# Patient Record
Sex: Male | Born: 1995
Health system: Southern US, Community
[De-identification: ages and names within clinical notes are randomized; demographics above are authoritative.]

## PROBLEM LIST (undated history)

## (undated) ENCOUNTER — Emergency Department (HOSPITAL_COMMUNITY): Admission: EM | Payer: No Typology Code available for payment source | Source: Home / Self Care

---
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
## 2017-03-10 ENCOUNTER — Encounter (HOSPITAL_COMMUNITY): Payer: Self-pay | Admitting: Emergency Medicine

## 2017-03-10 ENCOUNTER — Emergency Department (HOSPITAL_COMMUNITY)
Admission: EM | Admit: 2017-03-10 | Discharge: 2017-03-10 | Disposition: A | Discharge status: Home / Self Care | Payer: Medicaid Other | Attending: Emergency Medicine | Admitting: Emergency Medicine

## 2017-03-10 DIAGNOSIS — S3992XA Unspecified injury of lower back, initial encounter: Secondary | ICD-10-CM | POA: Diagnosis present

## 2017-03-10 DIAGNOSIS — Y9389 Activity, other specified: Secondary | ICD-10-CM | POA: Insufficient documentation

## 2017-03-10 DIAGNOSIS — S39012A Strain of muscle, fascia and tendon of lower back, initial encounter: Secondary | ICD-10-CM | POA: Diagnosis not present

## 2017-03-10 DIAGNOSIS — X503XXA Overexertion from repetitive movements, initial encounter: Secondary | ICD-10-CM | POA: Diagnosis not present

## 2017-03-10 DIAGNOSIS — Y990 Civilian activity done for income or pay: Secondary | ICD-10-CM | POA: Insufficient documentation

## 2017-03-10 DIAGNOSIS — Y929 Unspecified place or not applicable: Secondary | ICD-10-CM | POA: Insufficient documentation

## 2017-03-10 LAB — CBC WITH DIFFERENTIAL/PLATELET
Basophils Absolute: 0 10*3/uL (ref 0.0–0.1)
Basophils Relative: 0 %
Eosinophils Absolute: 0.1 10*3/uL (ref 0.0–0.7)
Eosinophils Relative: 1 %
HCT: 47.5 % (ref 39.0–52.0)
Hemoglobin: 16.5 g/dL (ref 13.0–17.0)
Lymphocytes Relative: 28 %
Lymphs Abs: 1.9 10*3/uL (ref 0.7–4.0)
MCH: 31.3 pg (ref 26.0–34.0)
MCHC: 34.7 g/dL (ref 30.0–36.0)
MCV: 90 fL (ref 78.0–100.0)
Monocytes Absolute: 0.6 10*3/uL (ref 0.1–1.0)
Monocytes Relative: 9 %
Neutro Abs: 4.4 10*3/uL (ref 1.7–7.7)
Neutrophils Relative %: 62 %
Platelets: 187 10*3/uL (ref 150–400)
RBC: 5.28 MIL/uL (ref 4.22–5.81)
RDW: 12.8 % (ref 11.5–15.5)
WBC: 7 10*3/uL (ref 4.0–10.5)

## 2017-03-10 LAB — BASIC METABOLIC PANEL
Anion gap: 10 (ref 5–15)
BUN: 10 mg/dL (ref 6–20)
CHLORIDE: 101 mmol/L (ref 101–111)
CO2: 28 mmol/L (ref 22–32)
CREATININE: 1.03 mg/dL (ref 0.61–1.24)
Calcium: 9.4 mg/dL (ref 8.9–10.3)
GFR calc Af Amer: >60 mL/min (ref >60)
GFR calc non Af Amer: >60 mL/min (ref >60)
Glucose, Bld: 95 mg/dL (ref 65–99)
Potassium: 3.8 mmol/L (ref 3.5–5.1)
SODIUM: 139 mmol/L (ref 135–145)

## 2017-03-10 LAB — URINALYSIS, ROUTINE W REFLEX MICROSCOPIC
BILIRUBIN URINE: NEGATIVE
Glucose, UA: NEGATIVE mg/dL
Hgb urine dipstick: NEGATIVE
KETONES UR: NEGATIVE mg/dL
Leukocytes, UA: NEGATIVE
Nitrite: NEGATIVE
PH: 7 (ref 5.0–8.0)
Protein, ur: NEGATIVE mg/dL
SPECIFIC GRAVITY, URINE: 1.025 (ref 1.005–1.030)

## 2017-03-10 MED ORDER — CYCLOBENZAPRINE HCL 5 MG PO TABS: 5.0000 mg | ORAL_TABLET | Freq: Two times a day (BID) | ORAL | 0 refills | Status: DC | PRN

## 2017-03-10 MED ORDER — NAPROXEN 500 MG PO TABS: 500.0000 mg | ORAL_TABLET | Freq: Two times a day (BID) | ORAL | 0 refills | Status: DC | PRN

## 2017-03-10 NOTE — ED Triage Notes (Signed)
Patient reports right flank pain onset Monday with mild intermittent dysuria , denies injury or hematuria , no fever or chills .

## 2017-03-10 NOTE — ED Provider Notes (Signed)
[facility] Provider Note    By signing my name below, I, Earmon Phoenix, attest that this documentation has been prepared under the direction and in the presence of Fayrene Helper, PA-C. Electronically Signed: Earmon Phoenix, ED Scribe. 03/10/17. 7:57 PM.    History   Chief Complaint Chief Complaint  Patient presents with  . Flank Pain   The history is provided by the patient and medical records. No language interpreter was used.    Darrell Winters is a 21 y.o. male who presents to the Emergency Department complaining of worsening right flank pain that began three days ago. He states the pain began as just soreness but started being sharp yesterday. He has not taken anything for pain. Stretching mildly increases the pain. Not touching the area helps alleviate the pain. He denies fever, chills, diarrhea, nausea, vomiting, difficulty urinating, hematuria, penile or testicular pain, numbness, tingling or weakness of the lower extremities. He states he had his urine checked about 4-6 weeks ago and was told he had traces of blood in the urine at that time. He denies any trauma, injury or fall. He denies any heavy lifting. 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 He denies allergies to any medications. He denies taking any daily medications. His PCP is Dr. Welton Flakes.   History reviewed. No pertinent past medical history.  There are no active problems to display for this patient.   History reviewed. No pertinent surgical history.     Home Medications    Prior to Admission medications   Medication Sig Start Date End Date Taking? Authorizing Provider  albuterol (PROVENTIL HFA;VENTOLIN HFA) 108 (90 BASE) MCG/ACT inhaler Inhale 2 puffs into the lungs every 6 (six) hours as needed for wheezing or shortness of breath. 04/20/15   Jennifer Piepenbrink, PA-C  amoxicillin (AMOXIL) 500 MG capsule Take 1 capsule (500 mg total)  by mouth 3 (three) times daily. 04/20/15   Jennifer Piepenbrink, PA-C  cyclobenzaprine (FLEXERIL) 5 MG tablet Take 1 tablet (5 mg total) by mouth 2 (two) times daily as needed for muscle spasms. 03/10/17   Fayrene Helper, PA-C  guaiFENesin (ROBITUSSIN) 100 MG/5ML liquid Take 5-10 mLs (100-200 mg total) by mouth every 4 (four) hours as needed for cough. 04/20/15   Jennifer Piepenbrink, PA-C  naproxen (NAPROSYN) 500 MG tablet Take 1 tablet (500 mg total) by mouth 2 (two) times daily as needed for mild pain or moderate pain. 03/10/17   Fayrene Helper, PA-C    Family History No family history on file.  Social History Social History  Substance Use Topics  . Smoking status: Never Smoker  . Smokeless tobacco: Never Used  . Alcohol use No     Allergies   Patient has no known allergies.   Review of Systems Review of Systems  Constitutional: Negative for chills and fever.  Gastrointestinal: Negative for diarrhea, nausea and vomiting.  Genitourinary: Positive for dysuria and flank pain. Negative for difficulty urinating, hematuria, penile pain and testicular pain.  Neurological: Negative for weakness and numbness.     Physical Exam Updated Vital Signs BP 136/73 (BP Location: Left Arm)   Pulse 72   Temp 98.2 F (36.8 C) (Oral)   Resp 16   Ht  (1.727 m)   Wt 155 lb (70.3 kg)   SpO2 99%   BMI 23.57 kg/m   Physical Exam  Constitutional: He is oriented to person, place, and time. He appears well-developed  and well-nourished.  HENT:  Head: Normocephalic and atraumatic.  Neck: Normal range of motion.  Cardiovascular: Normal rate.   Pulmonary/Chest: Effort normal.  Abdominal: There is no CVA tenderness.  Musculoskeletal: Normal range of motion. He exhibits tenderness. He exhibits no deformity.  No midline spine tenderness. Tenderness to palpation to right paraspinal lumbar region at level of L2-L4. No overlying skin changes.  Neurological: He is alert and oriented to person, place, and  time.  Patellar DTRs intact bilaterally. Ambulates without difficulty.  Skin: Skin is warm and dry.  Psychiatric: He has a normal mood and affect. His behavior is normal.  Nursing note and vitals reviewed.    ED Treatments / Results  DIAGNOSTIC STUDIES: Oxygen Saturation is 99% on RA, normal by my interpretation.   COORDINATION OF CARE: 7:53 PM- Reassured pt that his symptoms seem to be musculoskeletal in nature. Return precautions discussed. Will prescribe Naprosyn and Flexeril. Pt verbalizes understanding and agrees to plan.  Medications - No data to display  Labs (all labs ordered are listed, but only abnormal results are displayed) Labs Reviewed  CBC WITH DIFFERENTIAL/PLATELET  BASIC METABOLIC PANEL  URINALYSIS, ROUTINE W REFLEX MICROSCOPIC    EKG  EKG Interpretation None       Radiology No results found.  Procedures Procedures (including critical care time)  Medications Ordered in ED Medications - No data to display   Initial Impression / Assessment and Plan / ED Course  I have reviewed the triage vital signs and the nursing notes.  Pertinent labs & imaging results that were available during my care of the patient were reviewed by me and considered in my medical decision making (see chart for details).     BP 136/73 (BP Location: Left Arm)   Pulse 72   Temp 98.2 F (36.8 C) (Oral)   Resp 16   Ht  (1.727 m)   Wt 70.3 kg   SpO2 99%   BMI 23.57 kg/m    Final Clinical Impressions(s) / ED Diagnoses   Final diagnoses:  Low back strain, initial encounter    New Prescriptions New Prescriptions   CYCLOBENZAPRINE (FLEXERIL) 5 MG TABLET    Take 1 tablet (5 mg total) by mouth 2 (two) times daily as needed for muscle spasms.    I personally performed the services described in this documentation, which was scribed in my presence. The recorded information has been reviewed and is accurate.        Fayrene Helper, PA-C 04/08/17 2003    Nira Conn, MD 04/08/17 2216

## 2017-03-11 NOTE — ED Provider Notes (Signed)
By signing my name below, I, Earmon Phoenix, attest that this documentation has been prepared under the direction and in the presence of Fayrene Helper, PA-C. Electronically Signed: Earmon Phoenix, ED Scribe. 03/10/17. 7:57 PM.    History              Chief Complaint    Chief Complaint  Patient presents with  . Flank Pain   The history is provided by the patient and medical records. No language interpreter was used.    Darrell Winters is a 21 y.o. male who presents to the Emergency Department complaining of worsening right flank pain that began three days ago. He states the pain began as just soreness but started being sharp yesterday. He has not taken anything for pain. Stretching mildly increases the pain. Not touching the area helps alleviate the pain. He denies fever, chills, diarrhea, nausea, vomiting, difficulty urinating, hematuria, penile or testicular pain, numbness, tingling or weakness of the lower extremities. He states he had his urine checked about 4-6 weeks ago and was told he had traces of blood in the urine at that time. He denies any trauma, injury or fall. He denies any heavy lifting. 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 He denies allergies to any medications. He denies taking any daily medications. His PCP is Dr. Welton Flakes.   History reviewed. No pertinent past medical history.  There are no active problems to display for this patient.   History reviewed. No pertinent surgical history.    Home Medications            Prior to Admission medications   Medication Sig Start Date End Date Taking? Authorizing Provider  albuterol (PROVENTIL HFA;VENTOLIN HFA) 108 (90 BASE) MCG/ACT inhaler Inhale 2 puffs into the lungs every 6 (six) hours as needed for wheezing or shortness of breath. 04/20/15   Jennifer Piepenbrink, PA-C  amoxicillin (AMOXIL) 500 MG capsule Take 1 capsule (500 mg total)  by mouth 3 (three) times daily. 04/20/15   Jennifer Piepenbrink, PA-C  cyclobenzaprine (FLEXERIL) 5 MG tablet Take 1 tablet (5 mg total) by mouth 2 (two) times daily as needed for muscle spasms. 03/10/17   Fayrene Helper, PA-C  guaiFENesin (ROBITUSSIN) 100 MG/5ML liquid Take 5-10 mLs (100-200 mg total) by mouth every 4 (four) hours as needed for cough. 04/20/15   Jennifer Piepenbrink, PA-C  naproxen (NAPROSYN) 500 MG tablet Take 1 tablet (500 mg total) by mouth 2 (two) times daily as needed for mild pain or moderate pain. 03/10/17   Fayrene Helper, PA-C    Family History No family history on file.  Social History     Social History  Substance Use Topics  . Smoking status: Never Smoker  . Smokeless tobacco: Never Used  . Alcohol use No     Allergies           Patient has no known allergies.   Review of Systems Review of Systems  Constitutional: Negative for chills and fever.  Gastrointestinal: Negative for diarrhea, nausea and vomiting.  Genitourinary: Positive for dysuria and flank pain. Negative for difficulty urinating, hematuria, penile pain and testicular pain.  Neurological: Negative for weakness and numbness.     Physical Exam Updated Vital Signs BP 136/73 (BP Location: Left Arm)   Pulse 72   Temp 98.2 F (36.8 C) (Oral)   Resp 16   Ht  (1.727 m)   Wt 155 lb (70.3  kg)   SpO2 99%   BMI 23.57 kg/m   Physical Exam  Constitutional: He is oriented to person, place, and time. He appears well-developed and well-nourished.  HENT:  Head: Normocephalic and atraumatic.  Neck: Normal range of motion.  Cardiovascular: Normal rate.   Pulmonary/Chest: Effort normal.  Abdominal: There is no CVA tenderness.  Musculoskeletal: Normal range of motion. He exhibits tenderness. He exhibits no deformity.  No midline spine tenderness. Tenderness to palpation to right paraspinal lumbar region at level of L2-L4. No overlying skin changes.  Neurological: He is alert and  oriented to person, place, and time.  Patellar DTRs intact bilaterally. Ambulates without difficulty.  Skin: Skin is warm and dry.  Psychiatric: He has a normal mood and affect. His behavior is normal.  Nursing note and vitals reviewed.    ED Treatments / Results  DIAGNOSTIC STUDIES: Oxygen Saturation is 99% on RA, normal by my interpretation.   COORDINATION OF CARE: 7:53 PM- Reassured pt that his symptoms seem to be musculoskeletal in nature. Return precautions discussed. Will prescribe Naprosyn and Flexeril. Pt verbalizes understanding and agrees to plan.  Medications - No data to display  Labs (all labs ordered are listed, but only abnormal results are displayed) Labs Reviewed  CBC WITH DIFFERENTIAL/PLATELET  BASIC METABOLIC PANEL  URINALYSIS, ROUTINE W REFLEX MICROSCOPIC    EKG      EKG Interpretation None       Radiology ImagingResults(Last48hours)  No results found.    Procedures Procedures (including critical care time)  Medications Ordered in ED Medications - No data to display   Initial Impression / Assessment and Plan / ED Course  I have reviewed the triage vital signs and the nursing notes.  Pertinent labs & imaging results that were available during my care of the patient were reviewed by me and considered in my medical decision making (see chart for details).    Final Clinical Impressions(s) / ED Diagnoses   Final diagnoses:  Low back strain, initial encounter    New Prescriptions     New Prescriptions   CYCLOBENZAPRINE (FLEXERIL) 5 MG TABLET    Take 1 tablet (5 mg total) by mouth 2 (two) times daily as needed for muscle spasms.    I personally performed the services described in this documentation, which was scribed in my presence. The recorded information has been reviewed and is accurate.   Pt with reproducible low back pain.  No red flags.  Able to ambulate.  Likely MSK causing pain.  Doubt kidney stone.   SXS treatment provided.  Return precaution discussed.      Fayrene Helper, PA-C 03/11/17 1606    Nira Conn, MD 03/12/17 0040

## 2017-08-08 ENCOUNTER — Ambulatory Visit (INDEPENDENT_AMBULATORY_CARE_PROVIDER_SITE_OTHER): Payer: Medicaid Other

## 2017-08-08 ENCOUNTER — Ambulatory Visit (HOSPITAL_COMMUNITY)
Admission: EM | Admit: 2017-08-08 | Discharge: 2017-08-08 | Disposition: A | Discharge status: Home / Self Care | Payer: Medicaid Other | Attending: Internal Medicine | Admitting: Internal Medicine

## 2017-08-08 ENCOUNTER — Encounter (HOSPITAL_COMMUNITY): Payer: Self-pay | Admitting: Emergency Medicine

## 2017-08-08 DIAGNOSIS — M25531 Pain in right wrist: Secondary | ICD-10-CM

## 2017-08-08 MED ORDER — NAPROXEN 500 MG PO TABS: 500.0000 mg | ORAL_TABLET | Freq: Two times a day (BID) | ORAL | 0 refills | Status: AC

## 2017-08-08 NOTE — ED Triage Notes (Signed)
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

## 2017-08-08 NOTE — Discharge Instructions (Signed)
Xray negative for fracture or dislocation. Start Naproxen as directed. Ice compresses and elevation. Wear wrist splint during activity. This can take up to 3-4 weeks to completely resolve, but you should be feeling better each week. Follow up here or with PCP if symptoms worsen, changes for reevaluation.

## 2017-08-08 NOTE — ED Provider Notes (Signed)
[facility]    CSN: [id number] Arrival date & time: 08/08/17  1610     History   Chief Complaint Chief Complaint  Patient presents with  . Wrist Pain    HPI Darrell Winters is a 21 y.o. male.   21 year old male comes in for 1 day history of right wrist pain after doing pushups. Patient states he might of landed wrong on his wrist during the pushups. He has associated pain, with swelling, and some numbness and tingling. Has been doing ice compress, but has not taken anything for the pain. He states he has decreased motion of his right wrist due to the pain. Denies open wound.      History reviewed. No pertinent past medical history.  There are no active problems to display for this patient.   History reviewed. No pertinent surgical history.     Home Medications    Prior to Admission medications   Medication Sig Start Date End Date Taking? Authorizing Provider  naproxen (NAPROSYN) 500 MG tablet Take 1 tablet (500 mg total) by mouth 2 (two) times daily. 08/08/17 08/18/17  Belinda Fisher, PA-C    Family History History reviewed. No pertinent family history.  Social History Social History  Substance Use Topics  . Smoking status: Never Smoker  . Smokeless tobacco: Never Used  . Alcohol use No     Allergies   Patient has no known allergies.   Review of Systems Review of Systems  Reason unable to perform ROS: See HPI as above.     Physical Exam Triage Vital Signs ED Triage Vitals  Enc Vitals Group     BP 08/08/17 1700 124/70     Pulse Rate 08/08/17 1700 79     Resp 08/08/17 1700 18     Temp 08/08/17 1700 98.4 F (36.9 C)     Temp Source 08/08/17 1700 Oral     SpO2 08/08/17 1700 99 %     Weight --      Height --      Head Circumference --      Peak Flow --      Pain Score 08/08/17 1659 7     Pain Loc --      Pain Edu? --      Excl. in GC? --    No data found.   Updated Vital Signs BP 124/70 (BP Location: Left Arm)   Pulse 79    Temp 98.4 F (36.9 C) (Oral)   Resp 18   SpO2 99%   Visual Acuity Right Eye Distance:   Left Eye Distance:   Bilateral Distance:    Right Eye Near:   Left Eye Near:    Bilateral Near:     Physical Exam  Constitutional: He is oriented to person, place, and time. He appears well-developed and well-nourished. No distress.  HENT:  Head: Normocephalic and atraumatic.  Eyes: Pupils are equal, round, and reactive to light. Conjunctivae are normal.  Cardiovascular: Normal rate, regular rhythm and normal heart sounds.  Exam reveals no gallop and no friction rub.   No murmur heard. Pulmonary/Chest: Effort normal and breath sounds normal. He has no wheezes. He has no rales.  Musculoskeletal:  Tenderness on palpation of the ulnar aspect of right hand. Most tender on palpation of proximal fourth and fifth metacarpal. Decreased range of motion due to pain. Strength deferred due to pain. Sensation intact and equal bilaterally.  Radial pulses 2+ and equal bilaterally. Capillary refill less than  2 seconds.   Neurological: He is alert and oriented to person, place, and time.  Skin: Skin is warm and dry.     UC Treatments / Results  Labs (all labs ordered are listed, but only abnormal results are displayed) Labs Reviewed - No data to display  EKG  EKG Interpretation None       Radiology Dg Wrist Complete Right  Result Date: 08/08/2017 CLINICAL DATA:  Acute right wrist pain following injury today. Initial encounter. EXAM: RIGHT WRIST - COMPLETE 3+ VIEW COMPARISON:  None. FINDINGS: There is no evidence of fracture or dislocation. There is no evidence of arthropathy or other focal bone abnormality. Soft tissues are unremarkable. IMPRESSION: Negative. Electronically Signed   By: Harmon PierJeffrey  Hu M.D.   On: 08/08/2017 17:23    Procedures Procedures (including critical care time)  Medications Ordered in UC Medications - No data to display   Initial Impression / Assessment and Plan / UC  Course  I have reviewed the triage vital signs and the nursing notes.  Pertinent labs & imaging results that were available during my care of the patient were reviewed by me and considered in my medical decision making (see chart for details).    X-ray negative for fracture, dislocation. Start NSAID as directed for pain and inflammation. Ice compress and elevation. Wrist brace during activity. Discussed with patient strain can take up to 3-4 weeks to resolve, but should be getting better each week. Return precautions given.    Final Clinical Impressions(s) / UC Diagnoses   Final diagnoses:  Right wrist pain    New Prescriptions New Prescriptions   NAPROXEN (NAPROSYN) 500 MG TABLET    Take 1 tablet (500 mg total) by mouth 2 (two) times daily.       Belinda FisherYu, [place] Soy V, PA-C 08/08/17 1758

## 2017-11-03 ENCOUNTER — Ambulatory Visit (HOSPITAL_COMMUNITY)
Admission: EM | Admit: 2017-11-03 | Discharge: 2017-11-03 | Disposition: A | Discharge status: Home / Self Care | Payer: Self-pay | Attending: Physician Assistant | Admitting: Physician Assistant

## 2017-11-03 ENCOUNTER — Other Ambulatory Visit: Payer: Self-pay

## 2017-11-03 ENCOUNTER — Encounter (HOSPITAL_COMMUNITY): Payer: Self-pay

## 2017-11-03 DIAGNOSIS — J209 Acute bronchitis, unspecified: Secondary | ICD-10-CM

## 2017-11-03 MED ORDER — BENZONATATE 100 MG PO CAPS: 100.0000 mg | ORAL_CAPSULE | Freq: Three times a day (TID) | ORAL | 0 refills | Status: DC

## 2017-11-03 MED ORDER — PREDNISONE 10 MG PO TABS: 40.0000 mg | ORAL_TABLET | Freq: Every day | ORAL | 0 refills | Status: DC

## 2017-11-03 MED ORDER — PREDNISONE 10 MG PO TABS: 40.0000 mg | ORAL_TABLET | Freq: Every day | ORAL | 0 refills | Status: AC

## 2017-11-03 NOTE — ED Triage Notes (Signed)
[facility], pt states when he blows his nose or cough blood comes out.

## 2017-11-03 NOTE — ED Provider Notes (Signed)
[facility]    CSN: [id number] Arrival date & time: 11/03/17  1847     History   Chief Complaint Chief Complaint  Patient presents with  . Epistaxis    HPI Darrell Winters is a 21 y.o. male.   21 year-old male, presenting today due to cough. He states that he has had cough productive of clear blood-tinged sputum x 5 days He has had some nasal congestion and post nasal drip as well No fever, chills, headache, sore throat No chest pain, shortness of breath or wheezing       Cough  Cough characteristics:  Productive Sputum characteristics:  Bloody (clear and blood-tinged ) Severity:  Moderate Onset quality:  Gradual Duration:  5 days Timing:  Constant Progression:  Unchanged Chronicity:  New Smoker: no   Context: sick contacts (gf here with same symptoms )   Context: not animal exposure, not exposure to allergens, not fumes, not occupational exposure, not smoke exposure, not upper respiratory infection, not weather changes and not with activity   Relieved by:  Nothing Worsened by:  Nothing Ineffective treatments:  None tried Associated symptoms: no chest pain, no chills, no diaphoresis, no ear fullness, no ear pain, no eye discharge, no fever, no headaches, no myalgias, no rash, no rhinorrhea, no shortness of breath, no sinus congestion, no sore throat, no weight loss and no wheezing   Risk factors: no chemical exposure, no recent infection and no recent travel     History reviewed. No pertinent past medical history.  There are no active problems to display for this patient.   History reviewed. No pertinent surgical history.     Home Medications    Prior to Admission medications   Medication Sig Start Date End Date Taking? Authorizing Provider  benzonatate (TESSALON) 100 MG capsule Take 1 capsule (100 mg total) by mouth every 8 (eight) hours. 11/03/17   Blue, Olivia C, PA-C  predniSONE (DELTASONE) 10 MG tablet Take 4 tablets (40 mg total)  by mouth daily for 5 days. 11/03/17 11/08/17  Alecia LemmingBlue, Olivia C, PA-C    Family History History reviewed. No pertinent family history.  Social History Social History   Tobacco Use  . Smoking status: Never Smoker  . Smokeless tobacco: Never Used  Substance Use Topics  . Alcohol use: No  . Drug use: No     Allergies   Patient has no known allergies.   Review of Systems Review of Systems  Constitutional: Negative for chills, diaphoresis, fever and weight loss.  HENT: Negative for ear pain, rhinorrhea and sore throat.   Eyes: Negative for pain, discharge and visual disturbance.  Respiratory: Positive for cough. Negative for shortness of breath and wheezing.   Cardiovascular: Negative for chest pain and palpitations.  Gastrointestinal: Negative for abdominal pain and vomiting.  Genitourinary: Negative for dysuria and hematuria.  Musculoskeletal: Negative for arthralgias, back pain and myalgias.  Skin: Negative for color change and rash.  Neurological: Negative for seizures, syncope and headaches.  All other systems reviewed and are negative.    Physical Exam Triage Vital Signs ED Triage Vitals [11/03/17 1906]  Enc Vitals Group     BP 136/63     Pulse Rate 68     Resp 16     Temp 99.3 F (37.4 C)     Temp Source Oral     SpO2 100 %     Weight      Height      Head Circumference  Peak Flow      Pain Score      Pain Loc      Pain Edu?      Excl. in GC?    No data found.  Updated Vital Signs BP 136/63 (BP Location: Left Arm)   Pulse 68   Temp 99.3 F (37.4 C) (Oral)   Resp 16   SpO2 100%   Visual Acuity Right Eye Distance:   Left Eye Distance:   Bilateral Distance:    Right Eye Near:   Left Eye Near:    Bilateral Near:     Physical Exam  Constitutional: He appears well-developed and well-nourished.  HENT:  Head: Normocephalic and atraumatic.  Right Ear: Hearing, tympanic membrane, external ear and ear canal normal.  Left Ear: Hearing, tympanic  membrane, external ear and ear canal normal.  Nose: Nose normal.  Mouth/Throat: Uvula is midline and oropharynx is clear and moist. No oropharyngeal exudate, posterior oropharyngeal edema, posterior oropharyngeal erythema or tonsillar abscesses.  Eyes: Conjunctivae are normal.  Neck: Neck supple.  Cardiovascular: Normal rate and regular rhythm.  No murmur heard. Pulmonary/Chest: Effort normal and breath sounds normal. No stridor. No respiratory distress. He has no decreased breath sounds. He has no wheezes. He has no rhonchi. He has no rales.  Abdominal: Soft. There is no tenderness.  Musculoskeletal: He exhibits no edema.  Neurological: He is alert.  Skin: Skin is warm and dry.  Psychiatric: He has a normal mood and affect.  Nursing note and vitals reviewed.    UC Treatments / Results  Labs (all labs ordered are listed, but only abnormal results are displayed) Labs Reviewed - No data to display  EKG  EKG Interpretation None       Radiology No results found.  Procedures Procedures (including critical care time)  Medications Ordered in UC Medications - No data to display   Initial Impression / Assessment and Plan / UC Course  I have reviewed the triage vital signs and the nursing notes.  Pertinent labs & imaging results that were available during my care of the patient were reviewed by me and considered in my medical decision making (see chart for details).     Cough No fever  Or abnormal lung sounds Likely bronchitis Do not suspect pneumonia   Final Clinical Impressions(s) / UC Diagnoses   Final diagnoses:  Acute bronchitis, unspecified organism    ED Discharge Orders        Ordered    predniSONE (DELTASONE) 10 MG tablet  Daily,   Status:  Discontinued     11/03/17 1914    benzonatate (TESSALON) 100 MG capsule  Every 8 hours,   Status:  Discontinued     11/03/17 1914    benzonatate (TESSALON) 100 MG capsule  Every 8 hours     11/03/17 1915     predniSONE (DELTASONE) 10 MG tablet  Daily     11/03/17 1915       Controlled Substance Prescriptions [place] Controlled Substance Registry consulted? Not Applicable   Alecia LemmingBlue, Olivia C, [place]PA-C 11/03/17 1919

## 2018-01-30 ENCOUNTER — Encounter (HOSPITAL_COMMUNITY): Payer: Self-pay | Admitting: Emergency Medicine

## 2018-01-30 ENCOUNTER — Ambulatory Visit (HOSPITAL_COMMUNITY)
Admission: EM | Admit: 2018-01-30 | Discharge: 2018-01-30 | Disposition: A | Discharge status: Home / Self Care | Payer: Medicaid Other | Attending: Family Medicine | Admitting: Family Medicine

## 2018-01-30 DIAGNOSIS — J309 Allergic rhinitis, unspecified: Secondary | ICD-10-CM | POA: Diagnosis not present

## 2018-01-30 DIAGNOSIS — M545 Low back pain, unspecified: Secondary | ICD-10-CM

## 2018-01-30 MED ORDER — CYCLOBENZAPRINE HCL 10 MG PO TABS: 10.0000 mg | ORAL_TABLET | Freq: Two times a day (BID) | ORAL | 0 refills | Status: DC | PRN

## 2018-01-30 MED ORDER — NAPROXEN 500 MG PO TABS: 500.0000 mg | ORAL_TABLET | Freq: Two times a day (BID) | ORAL | 0 refills | Status: DC

## 2018-01-30 MED ORDER — CETIRIZINE HCL 10 MG PO TABS: 10.0000 mg | ORAL_TABLET | Freq: Every day | ORAL | 0 refills | Status: DC

## 2018-01-30 NOTE — ED Provider Notes (Signed)
  [facility]    CSN: [id number] Arrival date & time: 01/30/18  1652  Musculoskeletal Exam  Patient: Darrell Winters DOB: [birth year]  DOS: 01/30/2018  SUBJECTIVE:  Chief Complaint:   Chief Complaint  Patient presents with  . Back Pain  . Hemoptysis    Darrell Winters is a 22 y.o.  male for evaluation and treatment of his back pain.   Onset:  2 years ago. Got worse 2 days ago, no injury or change in activity.  Location: Lower middle Character:  aching and sharp  Progression of issue:  is unchanged Associated symptoms: Decreased range of motion Denies bowel/bladder incontinence or weakness Treatment: to date has been OTC NSAIDS and acetaminophen.   Neurovascular symptoms: no  Duration: 1 month  Associated symptoms: sinus congestion, rhinorrhea, itchy watery eyes and Will sometimes cough up blood and had bloody noses Denies: sinus pain, ear pain, ear drainage, sore throat, wheezing, shortness of breath, myalgia and Fevers Treatment to date: none Sick contacts: No   ROS: Musculoskeletal/Extremities: +back pain Neurologic: no numbness, tingling no weakness   History reviewed. No pertinent past medical history.  Objective:  VITAL SIGNS: BP 131/66   Pulse (!) 57   Temp 97.7 F (36.5 C) (Oral)   Resp 16   Ht 5\' 9"  (1.753 m)   Wt 160 lb (72.6 kg)   SpO2 100%   BMI 23.63 kg/m  Constitutional: Well formed, well developed. No acute distress. HENT: Normocephalic, atraumatic.  Ears negative bilaterally, nares are patent, no discharge, there is excoriated area of skin on the medial left septum, mucous membranes are moist, pharynx negative, there is a solid mass in the roof of his mouth over the hard palate.  It is ovoid in shape, flesh-colored, and not tender to palpation. Heart: RRR Thorax & Lungs:  CTAB, No accessory muscle use Extremities: No clubbing. No cyanosis. No edema.  Skin: Warm. Dry. No erythema. No rash.  Musculoskeletal: low back.     Tenderness to palpation: yes over lumbar paraspinal msc Deformity: no Ecchymosis: no Straight leg test: negative for  Hamstring range of motion is poor 5/5 strength throughout lower extremities Neurologic: Normal sensory function. No focal deficits noted. DTR's equal and symmetry in LE's. No clonus. Psychiatric: Normal mood. Age appropriate judgment and insight. Alert & oriented x 3.    Assessment:  Acute bilateral low back pain without sciatica  Allergic rhinitis, unspecified seasonality, unspecified trigger  Plan: Naproxen 500 mg twice daily, Flexeril 10 mg twice daily as needed for muscle spasms, heat, home stretches/exercises.  Tylenol. Humidify the air, oral antihistamine, push fluids.   He needs to establish with a PCP.  Particularly to get the lesion in his mouth further evaluated. The patient voiced understanding and agreement to the plan.    Sharlene DoryWendling, [name] Paul, [place] 01/30/18 [name]

## 2018-01-30 NOTE — ED Triage Notes (Signed)
PT reports intermittent low back pain for years. PT reports back pain waxes and wanes and it has been bothering him lately.  PT reports congestion for 1.5 month. PT reports blood streaked mucus in cough and nasal congestion for 1 month.

## 2018-01-30 NOTE — Discharge Instructions (Signed)
Heat (pad or rice pillow in microwave) over affected area, 10-15 minutes twice daily.   EXERCISES  RANGE OF MOTION (ROM) AND STRETCHING EXERCISES - Low Back Prain Most people with lower back pain will find that their symptoms get worse with excessive bending forward (flexion) or arching at the lower back (extension). The exercises that will help resolve your symptoms will focus on the opposite motion.  If you have pain, numbness or tingling which travels down into your buttocks, leg or foot, the goal of the therapy is for these symptoms to move closer to your back and eventually resolve. Sometimes, these leg symptoms will get better, but your lower back pain may worsen. This is often an indication of progress in your rehabilitation. Be very alert to any changes in your symptoms and the activities in which you participated in the 24 hours prior to the change. Sharing this information with your caregiver will allow him or her to most efficiently treat your condition. These exercises may help you when beginning to rehabilitate your injury. Your symptoms may resolve with or without further involvement from your physician, physical therapist or athletic trainer. While completing these exercises, remember:   Restoring tissue flexibility helps normal motion to return to the joints. This allows healthier, less painful movement and activity.  An effective stretch should be held for at least 30 seconds.  A stretch should never be painful. You should only feel a gentle lengthening or release in the stretched tissue. FLEXION RANGE OF MOTION AND STRETCHING EXERCISES:  STRETCH - Flexion, Single Knee to Chest   Lie on a firm bed or floor with both legs extended in front of you.  Keeping one leg in contact with the floor, bring your opposite knee to your chest. Hold your leg in place by either grabbing behind your thigh or at your knee.  Pull until you feel a gentle stretch in your low back. Hold 30  seconds.  Slowly release your grasp and repeat the exercise with the opposite side. Repeat 2 times. Complete this exercise 3 times per week.   STRETCH - Flexion, Double Knee to Chest  Lie on a firm bed or floor with both legs extended in front of you.  Keeping one leg in contact with the floor, bring your opposite knee to your chest.  Tense your stomach muscles to support your back and then lift your other knee to your chest. Hold your legs in place by either grabbing behind your thighs or at your knees.  Pull both knees toward your chest until you feel a gentle stretch in your low back. Hold 30 seconds.  Tense your stomach muscles and slowly return one leg at a time to the floor. Repeat 2 times. Complete this exercise 3 times per week.   STRETCH - Low Trunk Rotation  Lie on a firm bed or floor. Keeping your legs in front of you, bend your knees so they are both pointed toward the ceiling and your feet are flat on the floor.  Extend your arms out to the side. This will stabilize your upper body by keeping your shoulders in contact with the floor.  Gently and slowly drop both knees together to one side until you feel a gentle stretch in your low back. Hold for 30 seconds.  Tense your stomach muscles to support your lower back as you bring your knees back to the starting position. Repeat the exercise to the other side. Repeat 2 times. Complete this exercise at least   3 times per week.   EXTENSION RANGE OF MOTION AND FLEXIBILITY EXERCISES:  STRETCH - Extension, Prone on Elbows   Lie on your stomach on the floor, a bed will be too soft. Place your palms about shoulder width apart and at the height of your head.  Place your elbows under your shoulders. If this is too painful, stack pillows under your chest.  Allow your body to relax so that your hips drop lower and make contact more completely with the floor.  Hold this position for 30 seconds.  Slowly return to lying flat on the  floor. Repeat 2 times. Complete this exercise 3 times per week.   RANGE OF MOTION - Extension, Prone Press Ups  Lie on your stomach on the floor, a bed will be too soft. Place your palms about shoulder width apart and at the height of your head.  Keeping your back as relaxed as possible, slowly straighten your elbows while keeping your hips on the floor. You may adjust the placement of your hands to maximize your comfort. As you gain motion, your hands will come more underneath your shoulders.  Hold this position 30 seconds.  Slowly return to lying flat on the floor. Repeat 2 times. Complete this exercise 3 times per week.   RANGE OF MOTION- Quadruped, Neutral Spine   Assume a hands and knees position on a firm surface. Keep your hands under your shoulders and your knees under your hips. You may place padding under your knees for comfort.  Drop your head and point your tailbone toward the ground below you. This will round out your lower back like an angry cat. Hold this position for 30 seconds.  Slowly lift your head and release your tail bone so that your back sags into a large arch, like an old horse.  Hold this position for 30 seconds.  Repeat this until you feel limber in your low back.  Now, find your "sweet spot." This will be the most comfortable position somewhere between the two previous positions. This is your neutral spine. Once you have found this position, tense your stomach muscles to support your low back.  Hold this position for 30 seconds. Repeat 2 times. Complete this exercise 3 times per week.   STRENGTHENING EXERCISES - Low Back Sprain These exercises may help you when beginning to rehabilitate your injury. These exercises should be done near your "sweet spot." This is the neutral, low-back arch, somewhere between fully rounded and fully arched, that is your least painful position. When performed in this safe range of motion, these exercises can be used for people  who have either a flexion or extension based injury. These exercises may resolve your symptoms with or without further involvement from your physician, physical therapist or athletic trainer. While completing these exercises, remember:   Muscles can gain both the endurance and the strength needed for everyday activities through controlled exercises.  Complete these exercises as instructed by your physician, physical therapist or athletic trainer. Increase the resistance and repetitions only as guided.  You may experience muscle soreness or fatigue, but the pain or discomfort you are trying to eliminate should never worsen during these exercises. If this pain does worsen, stop and make certain you are following the directions exactly. If the pain is still present after adjustments, discontinue the exercise until you can discuss the trouble with your caregiver.  STRENGTHENING - Deep Abdominals, Pelvic Tilt   Lie on a firm bed or floor. Keeping your   legs in front of you, bend your knees so they are both pointed toward the ceiling and your feet are flat on the floor.  Tense your lower abdominal muscles to press your low back into the floor. This motion will rotate your pelvis so that your tail bone is scooping upwards rather than pointing at your feet or into the floor. With a gentle tension and even breathing, hold this position for 3 seconds. Repeat 2 times. Complete this exercise 3 times per week.   STRENGTHENING - Abdominals, Crunches   Lie on a firm bed or floor. Keeping your legs in front of you, bend your knees so they are both pointed toward the ceiling and your feet are flat on the floor. Cross your arms over your chest.  Slightly tip your chin down without bending your neck.  Tense your abdominals and slowly lift your trunk high enough to just clear your shoulder blades. Lifting higher can put excessive stress on the lower back and does not further strengthen your abdominal  muscles.  Control your return to the starting position. Repeat 2 times. Complete this exercise 3 times per week.   STRENGTHENING - Quadruped, Opposite UE/LE Lift   Assume a hands and knees position on a firm surface. Keep your hands under your shoulders and your knees under your hips. You may place padding under your knees for comfort.  Find your neutral spine and gently tense your abdominal muscles so that you can maintain this position. Your shoulders and hips should form a rectangle that is parallel with the floor and is not twisted.  Keeping your trunk steady, lift your right hand no higher than your shoulder and then your left leg no higher than your hip. Make sure you are not holding your breath. Hold this position for 30 seconds.  Continuing to keep your abdominal muscles tense and your back steady, slowly return to your starting position. Repeat with the opposite arm and leg. Repeat 2 times. Complete this exercise 3 times per week.   STRENGTHENING - Abdominals and Quadriceps, Straight Leg Raise   Lie on a firm bed or floor with both legs extended in front of you.  Keeping one leg in contact with the floor, bend the other knee so that your foot can rest flat on the floor.  Find your neutral spine, and tense your abdominal muscles to maintain your spinal position throughout the exercise.  Slowly lift your straight leg off the floor about 6 inches for a count of 3, making sure to not hold your breath.  Still keeping your neutral spine, slowly lower your leg all the way to the floor. Repeat this exercise with each leg 2 times. Complete this exercise 3 times per week.  POSTURE AND BODY MECHANICS CONSIDERATIONS - Low Back Sprain Keeping correct posture when sitting, standing or completing your activities will reduce the stress put on different body tissues, allowing injured tissues a chance to heal and limiting painful experiences. The following are general guidelines for improved  posture.  While reading these guidelines, remember:  The exercises prescribed by your provider will help you have the flexibility and strength to maintain correct postures.  The correct posture provides the best environment for your joints to work. All of your joints have less wear and tear when properly supported by a spine with good posture. This means you will experience a healthier, less painful body.  Correct posture must be practiced with all of your activities, especially prolonged sitting and standing.   Correct posture is as important when doing repetitive low-stress activities (typing) as it is when doing a single heavy-load activity (lifting).  RESTING POSITIONS Consider which positions are most painful for you when choosing a resting position. If you have pain with flexion-based activities (sitting, bending, stooping, squatting), choose a position that allows you to rest in a less flexed posture. You would want to avoid curling into a fetal position on your side. If your pain worsens with extension-based activities (prolonged standing, working overhead), avoid resting in an extended position such as sleeping on your stomach. Most people will find more comfort when they rest with their spine in a more neutral position, neither too rounded nor too arched. Lying on a non-sagging bed on your side with a pillow between your knees, or on your back with a pillow under your knees will often provide some relief. Keep in mind, being in any one position for a prolonged period of time, no matter how correct your posture, can still lead to stiffness.  PROPER SITTING POSTURE In order to minimize stress and discomfort on your spine, you must sit with correct posture. Sitting with good posture should be effortless for a healthy body. Returning to good posture is a gradual process. Many people can work toward this most comfortably by using various supports until they have the flexibility and strength to  maintain this posture on their own. When sitting with proper posture, your ears will fall over your shoulders and your shoulders will fall over your hips. You should use the back of the chair to support your upper back. Your lower back will be in a neutral position, just slightly arched. You may place a small pillow or folded towel at the base of your lower back for  support.  When working at a desk, create an environment that supports good, upright posture. Without extra support, muscles tire, which leads to excessive strain on joints and other tissues. Keep these recommendations in mind:  CHAIR:  A chair should be able to slide under your desk when your back makes contact with the back of the chair. This allows you to work closely.  The chair's height should allow your eyes to be level with the upper part of your monitor and your hands to be slightly lower than your elbows.  BODY POSITION  Your feet should make contact with the floor. If this is not possible, use a foot rest.  Keep your ears over your shoulders. This will reduce stress on your neck and low back.  INCORRECT SITTING POSTURES  If you are feeling tired and unable to assume a healthy sitting posture, do not slouch or slump. This puts excessive strain on your back tissues, causing more damage and pain. Healthier options include:  Using more support, like a lumbar pillow.  Switching tasks to something that requires you to be upright or walking.  Talking a brief walk.  Lying down to rest in a neutral-spine position.  PROLONGED STANDING WHILE SLIGHTLY LEANING FORWARD  When completing a task that requires you to lean forward while standing in one place for a long time, place either foot up on a stationary 2-4 inch high object to help maintain the best posture. When both feet are on the ground, the lower back tends to lose its slight inward curve. If this curve flattens (or becomes too large), then the back and your other joints  will experience too much stress, tire more quickly, and can cause pain.  CORRECT STANDING   POSTURES Proper standing posture should be assumed with all daily activities, even if they only take a few moments, like when brushing your teeth. As in sitting, your ears should fall over your shoulders and your shoulders should fall over your hips. You should keep a slight tension in your abdominal muscles to brace your spine. Your tailbone should point down to the ground, not behind your body, resulting in an over-extended swayback posture.   INCORRECT STANDING POSTURES  Common incorrect standing postures include a forward head, locked knees and/or an excessive swayback. WALKING Walk with an upright posture. Your ears, shoulders and hips should all line-up.  PROLONGED ACTIVITY IN A FLEXED POSITION When completing a task that requires you to bend forward at your waist or lean over a low surface, try to find a way to stabilize 3 out of 4 of your limbs. You can place a hand or elbow on your thigh or rest a knee on the surface you are reaching across. This will provide you more stability, so that your muscles do not tire as quickly. By keeping your knees relaxed, or slightly bent, you will also reduce stress across your lower back. CORRECT LIFTING TECHNIQUES  DO :  Assume a wide stance. This will provide you more stability and the opportunity to get as close as possible to the object which you are lifting.  Tense your abdominals to brace your spine. Bend at the knees and hips. Keeping your back locked in a neutral-spine position, lift using your leg muscles. Lift with your legs, keeping your back straight.  Test the weight of unknown objects before attempting to lift them.  Try to keep your elbows locked down at your sides in order get the best strength from your shoulders when carrying an object.     Always ask for help when lifting heavy or awkward objects. INCORRECT LIFTING TECHNIQUES DO NOT:    Lock your knees when lifting, even if it is a small object.  Bend and twist. Pivot at your feet or move your feet when needing to change directions.  Assume that you can safely pick up even a paperclip without proper posture.   

## 2018-08-10 ENCOUNTER — Emergency Department (HOSPITAL_COMMUNITY)
Admission: EM | Admit: 2018-08-10 | Discharge: 2018-08-11 | Disposition: A | Discharge status: Home / Self Care | Payer: BLUE CROSS/BLUE SHIELD | Attending: Emergency Medicine | Admitting: Emergency Medicine

## 2018-08-10 ENCOUNTER — Emergency Department (HOSPITAL_COMMUNITY): Payer: BLUE CROSS/BLUE SHIELD

## 2018-08-10 ENCOUNTER — Encounter (HOSPITAL_COMMUNITY): Payer: Self-pay | Admitting: Emergency Medicine

## 2018-08-10 DIAGNOSIS — R040 Epistaxis: Secondary | ICD-10-CM | POA: Diagnosis not present

## 2018-08-10 DIAGNOSIS — Z79899 Other long term (current) drug therapy: Secondary | ICD-10-CM | POA: Diagnosis not present

## 2018-08-10 DIAGNOSIS — F17290 Nicotine dependence, other tobacco product, uncomplicated: Secondary | ICD-10-CM | POA: Diagnosis not present

## 2018-08-10 DIAGNOSIS — J40 Bronchitis, not specified as acute or chronic: Secondary | ICD-10-CM | POA: Insufficient documentation

## 2018-08-10 DIAGNOSIS — R0602 Shortness of breath: Secondary | ICD-10-CM | POA: Diagnosis present

## 2018-08-10 LAB — CBC
HCT: 47.9 % (ref 39.0–52.0)
HEMOGLOBIN: 16.3 g/dL (ref 13.0–17.0)
MCH: 30.7 pg (ref 26.0–34.0)
MCHC: 34 g/dL (ref 30.0–36.0)
MCV: 90.2 fL (ref 78.0–100.0)
PLATELETS: 227 10*3/uL (ref 150–400)
RBC: 5.31 MIL/uL (ref 4.22–5.81)
RDW: 11.5 % (ref 11.5–15.5)
WBC: 6 10*3/uL (ref 4.0–10.5)

## 2018-08-10 LAB — COMPREHENSIVE METABOLIC PANEL
ALK PHOS: 81 U/L (ref 38–126)
ALT: 13 U/L (ref 0–44)
ANION GAP: 11 (ref 5–15)
AST: 20 U/L (ref 15–41)
Albumin: 4.6 g/dL (ref 3.5–5.0)
BUN: 12 mg/dL (ref 6–20)
CALCIUM: 9.9 mg/dL (ref 8.9–10.3)
CO2: 27 mmol/L (ref 22–32)
CREATININE: 1.12 mg/dL (ref 0.61–1.24)
Chloride: 103 mmol/L (ref 98–111)
Glucose, Bld: 79 mg/dL (ref 70–99)
Potassium: 3.5 mmol/L (ref 3.5–5.1)
Sodium: 141 mmol/L (ref 135–145)
Total Bilirubin: 1.6 mg/dL — ABNORMAL HIGH (ref 0.3–1.2)
Total Protein: 7 g/dL (ref 6.5–8.1)

## 2018-08-10 LAB — URINALYSIS, ROUTINE W REFLEX MICROSCOPIC
Bilirubin Urine: NEGATIVE
Glucose, UA: NEGATIVE mg/dL
Hgb urine dipstick: NEGATIVE
Ketones, ur: NEGATIVE mg/dL
LEUKOCYTES UA: NEGATIVE
NITRITE: NEGATIVE
PROTEIN: NEGATIVE mg/dL
Specific Gravity, Urine: 1.028 (ref 1.005–1.030)
pH: 6 (ref 5.0–8.0)

## 2018-08-10 LAB — LIPASE, BLOOD: LIPASE: 27 U/L (ref 11–51)

## 2018-08-10 LAB — I-STAT TROPONIN, ED: Troponin i, poc: 0.02 ng/mL (ref 0.00–0.08)

## 2018-08-10 NOTE — ED Provider Notes (Signed)
Patient placed in Quick Look pathway, seen and evaluated   Chief Complaint: chest pain  HPI:  Anterior, bilateral chest "tenderness" and tightness x 2 days.  Associated with SOB described as harder to take deep breaths, but painless. Recent hard palate procedure by ENT on Friday (brief, lasting 5 min)  ROS: positive: mild, cough with bloody streaks. Negative: fever  Physical Exam:   Gen: No distress  Neuro: Awake and Alert  Skin: Warm    Focused Exam: RRR. Lungs CTAB. No reproducible chest wall tenderness.    Initiation of care has begun. The patient has been counseled on the process, plan, and necessity for staying for the completion/evaluation, and the remainder of the medical screening examination    Darrell Winters 08/10/18 2019    Cathren Laine, MD 08/10/18 2245

## 2018-08-10 NOTE — ED Provider Notes (Signed)
[facility] EMERGENCY DEPARTMENT Provider Note   CSN: [id number] Arrival date & time: 08/10/18  1939     History   Chief Complaint Chief Complaint  Patient presents with  . Multiple Complaints    HPI Darrell Winters is a 22 y.o. male.  22 year old male with no significant past medical history presents to the emergency department for evaluation of shortness of breath and central chest tightness.  States the symptoms began 2 days ago.  Have been intermittent and wax and wane in severity.  Denies any known remitting factors of his symptoms.  Notes that shortness of breath and tightness will occur both at rest and with exertion.  Has been experiencing a cough productive of phlegm.  Noticed that he began coughing up blood today.  Did have a nosebleed earlier in the afternoon.  First episode of hemoptysis was around 1700.  Had a subsequent episode about 1 hour ago.  Denies taking any medications for his symptoms.  Has not had any wheezing, fevers, syncope, leg swelling, recent hospitalizations, prolonged travel.  Did have a very abbreviated procedure in an ENT office on Friday.  This lasted for approximately 5 minutes.  No other surgeries.  Denies any history of DVT/PE.  Is asymptomatic at this time.  The history is provided by the patient. No language interpreter was used.    History reviewed. No pertinent past medical history.  There are no active problems to display for this patient.   History reviewed. No pertinent surgical history.      Home Medications    Prior to Admission medications   Medication Sig Start Date End Date Taking? Authorizing Provider  cetirizine (ZYRTEC ALLERGY) 10 MG tablet Take 1 tablet (10 mg total) by mouth daily. 01/30/18   Sharlene Dory, DO  cyclobenzaprine (FLEXERIL) 10 MG tablet Take 1 tablet (10 mg total) by mouth 2 (two) times daily as needed for muscle spasms. 01/30/18   Sharlene Dory, DO  naproxen (NAPROSYN)  500 MG tablet Take 1 tablet (500 mg total) by mouth 2 (two) times daily. 01/30/18   Sharlene Dory, DO  predniSONE (DELTASONE) 20 MG tablet Take 2 tablets (40 mg total) by mouth daily. 08/11/18   Antony Madura, PA-C    Family History No family history on file.  Social History Social History   Tobacco Use  . Smoking status: Current Every Day Smoker    Types: E-cigarettes  . Smokeless tobacco: Never Used  Substance Use Topics  . Alcohol use: No  . Drug use: No     Allergies   Patient has no known allergies.   Review of Systems Review of Systems Ten systems reviewed and are negative for acute change, except as noted in the HPI.    Physical Exam Updated Vital Signs BP 123/75   Pulse (!) 51   Temp 98.2 F (36.8 C) (Oral)   Resp 16   Ht 5\' 8"  (1.727 m)   Wt 70.3 kg   SpO2 97%   BMI 23.57 kg/m   Physical Exam  Constitutional: He is oriented to person, place, and time. He appears well-developed and well-nourished. No distress.  Nontoxic appearing and in NAD  HENT:  Head: Normocephalic and atraumatic.  Eyes: Conjunctivae and EOM are normal. No scleral icterus.  Neck: Normal range of motion.  Cardiovascular: Normal rate, regular rhythm and intact distal pulses.  Pulmonary/Chest: Effort normal. No stridor. No respiratory distress. He has no wheezes. He has no rales.  Respirations  even and unlabored  Musculoskeletal: Normal range of motion.  Neurological: He is alert and oriented to person, place, and time. He exhibits normal muscle tone. Coordination normal.  GCS 15. Moving all extremities spontaneously.  Skin: Skin is warm and dry. No rash noted. He is not diaphoretic. No erythema. No pallor.  Psychiatric: He has a normal mood and affect. His behavior is normal.  Nursing note and vitals reviewed.    ED Treatments / Results  Labs (all labs ordered are listed, but only abnormal results are displayed) Labs Reviewed  COMPREHENSIVE METABOLIC PANEL - Abnormal;  Notable for the following components:      Result Value   Total Bilirubin 1.6 (*)    All other components within normal limits  LIPASE, BLOOD  CBC  URINALYSIS, ROUTINE W REFLEX MICROSCOPIC  D-DIMER, QUANTITATIVE (NOT AT [facility])  I-STAT TROPONIN, ED    EKG ED ECG REPORT   Date: 08/11/2018  Rate: 72  Rhythm: normal sinus rhythm  QRS Axis: right  Intervals: normal  ST/T Wave abnormalities: nonspecific ST changes  Conduction Disutrbances:Incomplete RBBB  Narrative Interpretation:   Old EKG Reviewed: none available  I have personally reviewed the EKG tracing and agree with the computerized printout as noted.   Radiology Dg Chest 2 View  Result Date: 08/10/2018 CLINICAL DATA:  Coughing up blood at work today EXAM: CHEST - 2 VIEW COMPARISON:  September 17, 2015 FINDINGS: The heart size and mediastinal contours are within normal limits. Both lungs are clear. The visualized skeletal structures are unremarkable. IMPRESSION: No active cardiopulmonary disease. Electronically Signed   By: Sherian Rein M.D.   On: 08/10/2018 20:46    Procedures Procedures (including critical care time)  Medications Ordered in ED Medications  albuterol (PROVENTIL HFA;VENTOLIN HFA) 108 (90 Base) MCG/ACT inhaler 1-2 puff (has no administration in time range)     Initial Impression / Assessment and Plan / ED Course  I have reviewed the triage vital signs and the nursing notes.  Pertinent labs & imaging results that were available during my care of the patient were reviewed by me and considered in my medical decision making (see chart for details).     Patient presents to the emergency department for evaluation of chest tightness and hemoptysis.  Low suspicion for emergent cardiac etiology given reassuring workup today.  EKG is nonischemic and troponin negative.  Patient has a heart score of 1 (smoking hx) consistent with low risk of acute coronary event.  Chest x-ray without evidence of mediastinal widening  to suggest dissection.  No pneumothorax, pneumonia, pleural effusion.  Pulmonary embolus further considered; however, patient without tachycardia, tachypnea, dyspnea, hypoxia.  D dimer is negative.  Suspect source of hemoptysis may be due to bronchitis, though patient does also report bloody from his sinuses earlier.  This may have resulted in bloody postnasal drip which was later expectorated.  Will manage with 5-day course of prednisone as well as use of an albuterol inhaler as needed.  Return precautions discussed and provided. Patient discharged in stable condition with no unaddressed concerns.   Final Clinical Impressions(s) / ED Diagnoses   Final diagnoses:  Bronchitis    ED Discharge Orders         Ordered    predniSONE (DELTASONE) 20 MG tablet  Daily     08/11/18 0120           Antony Madura, PA-C 08/11/18 0131    Cathren Laine, MD 08/11/18 [phone]

## 2018-08-10 NOTE — ED Triage Notes (Signed)
Pt presents with multiple complaints. Pt states he was at work today when he started coughing up blood. Pt recently had procedure done on the roof of his mouth and thought that's where the blood was coming from initially. Also reports chest tightness, upper back pain, SOB.

## 2018-08-11 LAB — D-DIMER, QUANTITATIVE: D-Dimer, Quant: <0.27 ug/mL-FEU (ref 0.00–0.50)

## 2018-08-11 MED ORDER — PREDNISONE 20 MG PO TABS: 40.0000 mg | ORAL_TABLET | Freq: Every day | ORAL | 0 refills | Status: DC

## 2018-08-11 MED ORDER — ALBUTEROL SULFATE HFA 108 (90 BASE) MCG/ACT IN AERS
1.0000 | INHALATION_SPRAY | Freq: Four times a day (QID) | RESPIRATORY_TRACT | Status: DC | PRN
  Filled 2018-08-11: qty 6.7

## 2018-08-11 NOTE — Discharge Instructions (Signed)
Your work-up in the emergency department was reassuring.  We recommend that you take prednisone as prescribed until finished.  Use 2 puffs of an albuterol inhaler every 4-6 hours as needed for wheezing and chest tightness.  Take over-the-counter cough medicine, such as Mucinex, as needed for persistent cough.  Follow up with your primary care doctor regarding your visit today.

## 2018-08-27 IMAGING — DX DG WRIST COMPLETE 3+V*R*
4 series · 4 of 4 positions shown · non-contrast
Comparison: None.

CLINICAL DATA: Acute right wrist pain following injury today.
Initial encounter.

EXAM:
RIGHT WRIST - COMPLETE 3+ VIEW

[wrist pa]
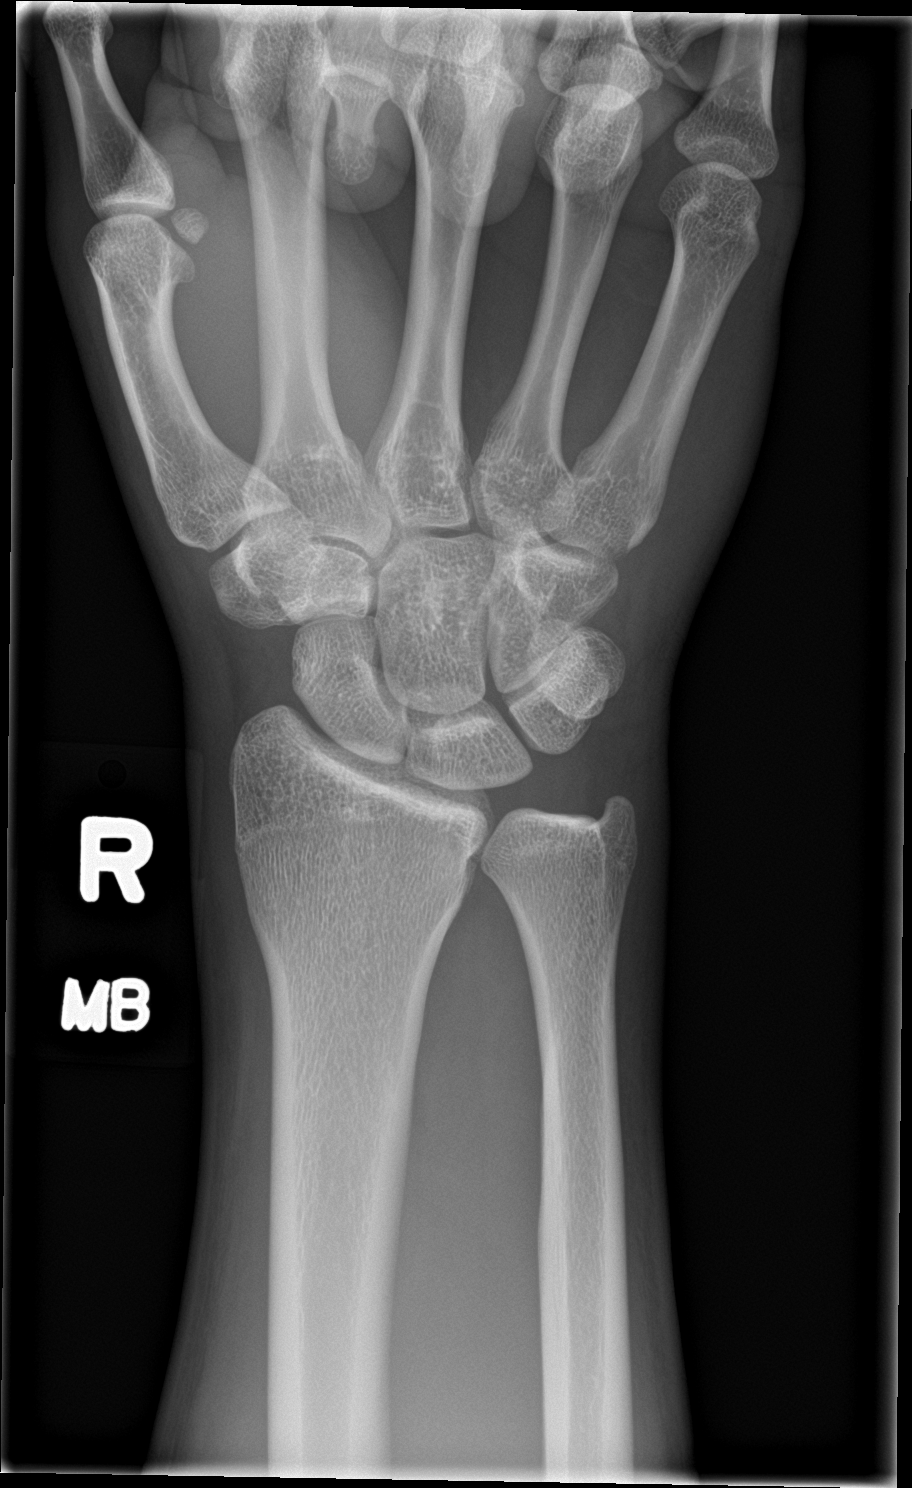

[wrist navicular]
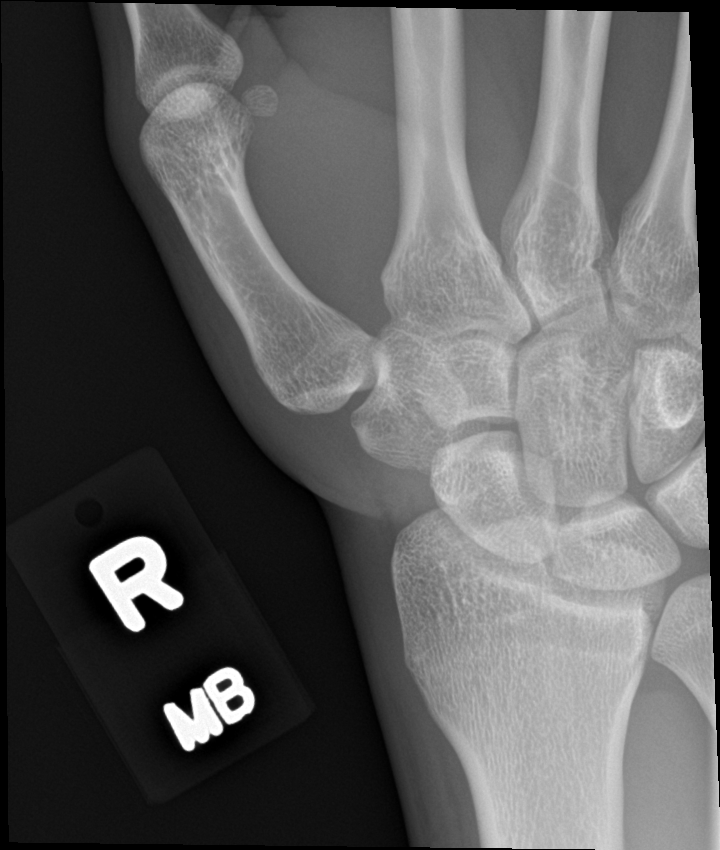

[wrist obl]
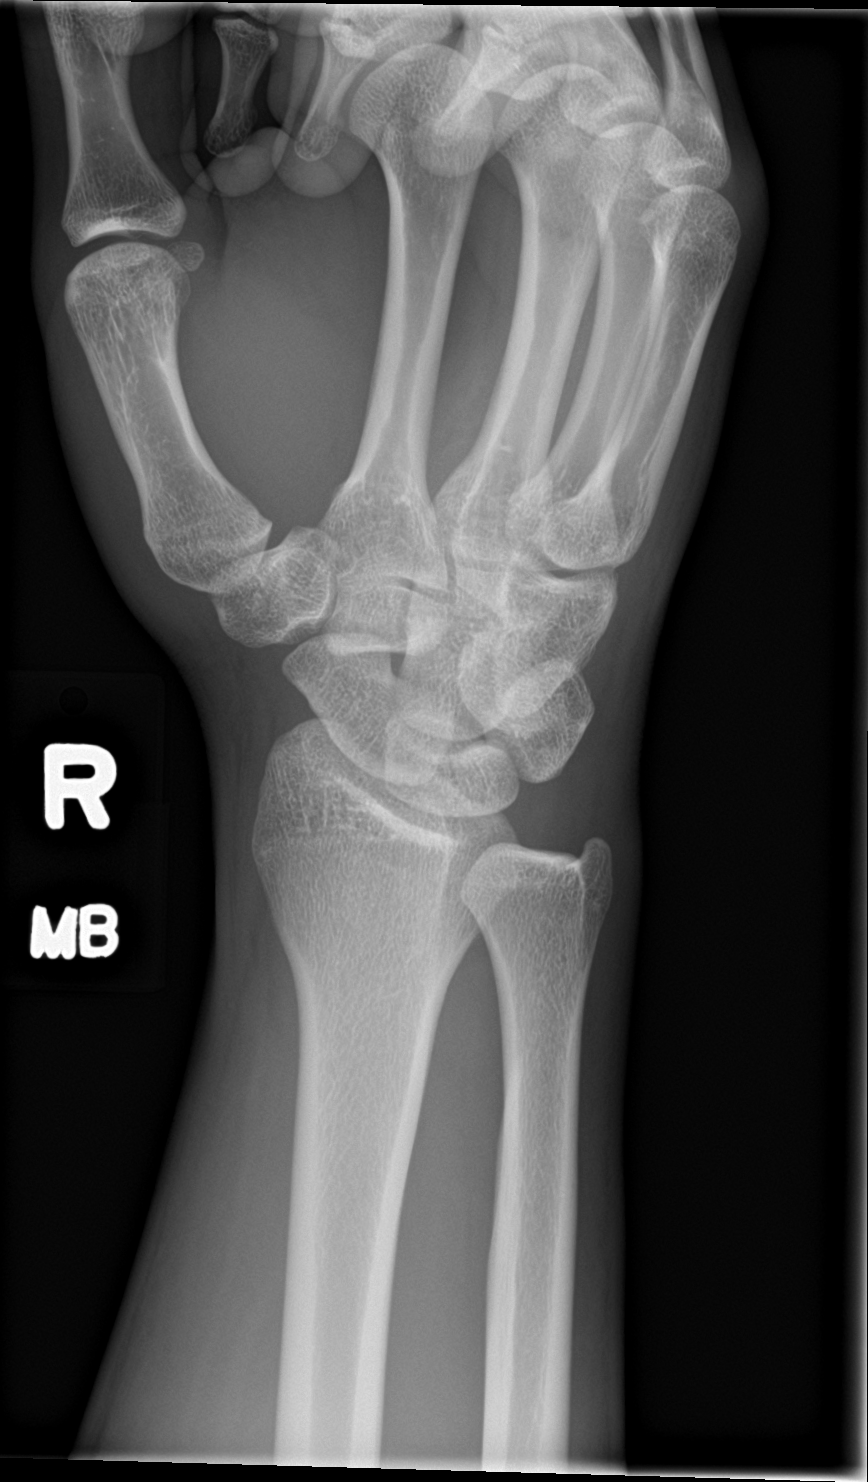

[wrist lat]
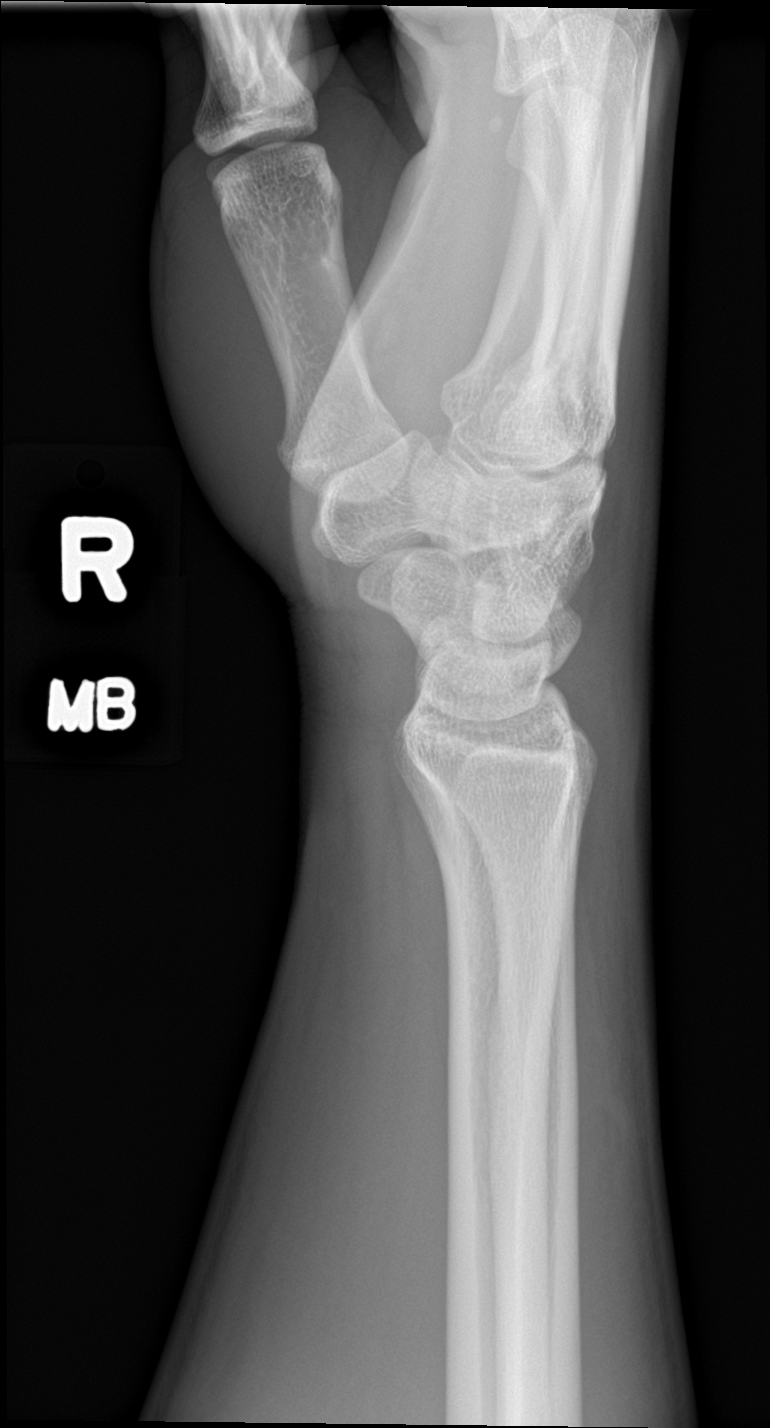

[4 of 4 positions shown; findings below may reference images not displayed]

FINDINGS: There is no evidence of fracture or dislocation. There is no
evidence of arthropathy or other focal bone abnormality. Soft
tissues are unremarkable.
IMPRESSION: Negative.

## 2019-01-29 ENCOUNTER — Emergency Department (HOSPITAL_COMMUNITY)
Admission: EM | Admit: 2019-01-29 | Discharge: 2019-01-29 | Disposition: A | Discharge status: Home / Self Care | Payer: BLUE CROSS/BLUE SHIELD | Attending: Emergency Medicine | Admitting: Emergency Medicine

## 2019-01-29 ENCOUNTER — Other Ambulatory Visit: Payer: Self-pay

## 2019-01-29 ENCOUNTER — Encounter (HOSPITAL_COMMUNITY): Payer: Self-pay | Admitting: Emergency Medicine

## 2019-01-29 DIAGNOSIS — F17210 Nicotine dependence, cigarettes, uncomplicated: Secondary | ICD-10-CM | POA: Insufficient documentation

## 2019-01-29 DIAGNOSIS — J069 Acute upper respiratory infection, unspecified: Secondary | ICD-10-CM

## 2019-01-29 LAB — GROUP A STREP BY PCR: GROUP A STREP BY PCR: NOT DETECTED

## 2019-01-29 NOTE — ED Provider Notes (Signed)
[facility] EMERGENCY DEPARTMENT Provider Note   CSN: [id number] Arrival date & time: 01/29/19  1154    History   Chief Complaint Chief Complaint  Patient presents with  . URI    HPI Collen L Winters is a 23 y.o. male.     The history is provided by the patient.  URI  Presenting symptoms: congestion, cough, fatigue, fever and sore throat   Severity:  Moderate Onset quality:  Sudden Duration:  2 days Timing:  Constant Progression:  Worsening Chronicity:  New Relieved by:  Nothing Worsened by:  Nothing Ineffective treatments:  None tried Associated symptoms: arthralgias and myalgias     History reviewed. No pertinent past medical history.  There are no active problems to display for this patient.   History reviewed. No pertinent surgical history.      Home Medications    Prior to Admission medications   Medication Sig Start Date End Date Taking? Authorizing Provider  cetirizine (ZYRTEC ALLERGY) 10 MG tablet Take 1 tablet (10 mg total) by mouth daily. 01/30/18   Sharlene Dory, DO  cyclobenzaprine (FLEXERIL) 10 MG tablet Take 1 tablet (10 mg total) by mouth 2 (two) times daily as needed for muscle spasms. 01/30/18   Sharlene Dory, DO  naproxen (NAPROSYN) 500 MG tablet Take 1 tablet (500 mg total) by mouth 2 (two) times daily. 01/30/18   Sharlene Dory, DO  predniSONE (DELTASONE) 20 MG tablet Take 2 tablets (40 mg total) by mouth daily. 08/11/18   Antony Madura, PA-C    Family History No family history on file.  Social History Social History   Tobacco Use  . Smoking status: Current Every Day Smoker    Types: E-cigarettes  . Smokeless tobacco: Never Used  Substance Use Topics  . Alcohol use: No  . Drug use: No     Allergies   Patient has no known allergies.   Review of Systems Review of Systems  Constitutional: Positive for fatigue and fever.  HENT: Positive for congestion and sore throat.     Respiratory: Positive for cough.   Musculoskeletal: Positive for arthralgias and myalgias.  All other systems reviewed and are negative.    Physical Exam Updated Vital Signs BP (!) 149/74 (BP Location: Right Arm)   Pulse 83   Temp 97.9 F (36.6 C) (Oral)   Ht 5\' 9"  (1.753 m)   Wt 74.8 kg   SpO2 98%   BMI 24.37 kg/m   Physical Exam Vitals signs and nursing note reviewed.  Constitutional:      General: He is not in acute distress.    Appearance: He is well-developed. He is not diaphoretic.  HENT:     Head: Normocephalic and atraumatic.     Mouth/Throat:     Mouth: Mucous membranes are moist.     Pharynx: Posterior oropharyngeal erythema present. No oropharyngeal exudate.  Neck:     Musculoskeletal: Normal range of motion and neck supple.  Cardiovascular:     Rate and Rhythm: Normal rate and regular rhythm.     Heart sounds: No murmur. No friction rub.  Pulmonary:     Effort: Pulmonary effort is normal. No respiratory distress.     Breath sounds: Normal breath sounds. No wheezing or rales.  Abdominal:     General: Bowel sounds are normal. There is no distension.     Palpations: Abdomen is soft.     Tenderness: There is no abdominal tenderness.  Musculoskeletal: Normal range of motion.  Lymphadenopathy:     Cervical: Cervical adenopathy present.  Skin:    General: Skin is warm and dry.  Neurological:     Mental Status: He is alert and oriented to person, place, and time.     Coordination: Coordination normal.      ED Treatments / Results  Labs (all labs ordered are listed, but only abnormal results are displayed) Labs Reviewed  GROUP A STREP BY PCR    EKG None  Radiology No results found.  Procedures Procedures (including critical care time)  Medications Ordered in ED Medications - No data to display   Initial Impression / Assessment and Plan / ED Course  I have reviewed the triage vital signs and the nursing notes.  Pertinent labs & imaging  results that were available during my care of the patient were reviewed by me and considered in my medical decision making (see chart for details).  Patient strep test is negative.  Symptoms sound viral in nature.  I see no indication for antibiotics.  Patient to drink plenty of fluids and take over-the-counter medications.  He is to follow-up as needed if not improving.  Final Clinical Impressions(s) / ED Diagnoses   Final diagnoses:  None    ED Discharge Orders    None       Geoffery Lyons, MD 01/29/19 1317

## 2019-01-29 NOTE — Discharge Instructions (Addendum)
Drink plenty of fluids and get plenty of rest.  Over-the-counter medications as needed for relief of symptoms.  Return to the emergency department for severe chest pain, difficulty breathing, or other new and concerning symptoms.

## 2019-01-29 NOTE — ED Notes (Addendum)
ED Provider at bedside. See EDP assessment.  

## 2019-01-29 NOTE — ED Triage Notes (Signed)
Pt reports sore throat, cough, runny nose and body aches that started a couple of days ago. No fever.

## 2019-07-12 ENCOUNTER — Emergency Department (HOSPITAL_COMMUNITY)
Admission: EM | Admit: 2019-07-12 | Discharge: 2019-07-12 | Disposition: A | Discharge status: Home / Self Care | Payer: Self-pay | Attending: Emergency Medicine | Admitting: Emergency Medicine

## 2019-07-12 ENCOUNTER — Other Ambulatory Visit: Payer: Self-pay

## 2019-07-12 DIAGNOSIS — Z20828 Contact with and (suspected) exposure to other viral communicable diseases: Secondary | ICD-10-CM | POA: Insufficient documentation

## 2019-07-12 DIAGNOSIS — R05 Cough: Secondary | ICD-10-CM | POA: Insufficient documentation

## 2019-07-12 DIAGNOSIS — F17200 Nicotine dependence, unspecified, uncomplicated: Secondary | ICD-10-CM | POA: Insufficient documentation

## 2019-07-12 DIAGNOSIS — R5383 Other fatigue: Secondary | ICD-10-CM | POA: Insufficient documentation

## 2019-07-12 DIAGNOSIS — R110 Nausea: Secondary | ICD-10-CM | POA: Insufficient documentation

## 2019-07-12 LAB — SARS CORONAVIRUS 2 (TAT 6-24 HRS): SARS Coronavirus 2: NEGATIVE

## 2019-07-12 NOTE — Discharge Instructions (Signed)
Please read attached information. If you experience any new or worsening signs or symptoms please return to the emergency room for evaluation. Please follow-up with your primary care provider or specialist as discussed.  °

## 2019-07-12 NOTE — ED Triage Notes (Signed)
Pt reports nausea since this morning and generalized weakness. Pt reports cough, sneezing as well. Pt reported a fever this morning and took asprin

## 2019-07-12 NOTE — ED Provider Notes (Signed)
[facility] EMERGENCY [facility]PARTMENT Provider Note   CSN: [id number] Arrival date & time: 07/12/19  1225    History   Chief Complaint Chief Complaint  Patient presents with  . Nausea    HPI Samvel L Mayo Aonguiano is a 23 y.o. male.     HPI   23 year old male presents today with complaints of fatigue.  Patient notes today he was more tired at work than usual.  He notes he has been coughing over the last several days, nonproductive.  He notes he felt hot today.  He denies any close sick contacts.  He notes history of bronchitis in the past but no other significant past medical history.   No past medical history on file.  There are no active problems to display for this patient.   No past surgical history on file.      Home Medications    Prior to Admission medications   Medication Sig Start Date End Date Taking? Authorizing Provider  cetirizine (ZYRTEC ALLERGY) 10 MG tablet Take 1 tablet (10 mg total) by mouth daily. 01/30/18   Sharlene DoryWendling, Nicholas Paul, DO  cyclobenzaprine (FLEXERIL) 10 MG tablet Take 1 tablet (10 mg total) by mouth 2 (two) times daily as needed for muscle spasms. 01/30/18   Sharlene DoryWendling, Nicholas Paul, DO  naproxen (NAPROSYN) 500 MG tablet Take 1 tablet (500 mg total) by mouth 2 (two) times daily. 01/30/18   Sharlene DoryWendling, Nicholas Paul, DO  predniSONE (DELTASONE) 20 MG tablet Take 2 tablets (40 mg total) by mouth daily. 08/11/18   Antony MaduraHumes, Kelly, PA-C    Family History No family history on file.  Social History Social History   Tobacco Use  . Smoking status: Current Every Day Smoker    Types: E-cigarettes  . Smokeless tobacco: Never Used  Substance Use Topics  . Alcohol use: No  . Drug use: No     Allergies   Patient has no known allergies.   Review of Systems Review of Systems  All other systems reviewed and are negative.    Physical Exam Updated Vital Signs BP (!) 147/72 (BP Location: Left Arm)   Pulse 72   Temp 98 F (36.7 C)  (Oral)   Resp 18   SpO2 100%   Physical Exam Vitals signs and nursing note reviewed.  Constitutional:      Appearance: He is well-developed.  HENT:     Head: Normocephalic and atraumatic.  Eyes:     General: No scleral icterus.       Right eye: No discharge.        Left eye: No discharge.     Conjunctiva/sclera: Conjunctivae normal.     Pupils: Pupils are equal, round, and reactive to light.  Neck:     Musculoskeletal: Normal range of motion.     Vascular: No JVD.     Trachea: No tracheal deviation.  Pulmonary:     Effort: Pulmonary effort is normal. No respiratory distress.     Breath sounds: Normal breath sounds. No stridor. No wheezing, rhonchi or rales.  Neurological:     Mental Status: He is alert and oriented to person, place, and time.     Coordination: Coordination normal.  Psychiatric:        Behavior: Behavior normal.        Thought Content: Thought content normal.        Judgment: Judgment normal.      ED Treatments / Results  Labs (all labs ordered are listed, but only  abnormal results are displayed) Labs Reviewed  NOVEL CORONAVIRUS, NAA (HOSPITAL ORDER, SEND-OUT TO REF LAB)    EKG None  Radiology No results found.  Procedures Procedures (including critical care time)  Medications Ordered in ED Medications - No data to display   Initial Impression / Assessment and Plan / ED Course  I have reviewed the triage vital signs and the nursing notes.  Pertinent labs & imaging results that were available during my care of the patient were reviewed by me and considered in my medical decision making (see chart for details).        23 year old male presents today with vague symptoms.  Question early viral illness.  Patient will be tested for COVID-19.  Discharged with return precautions and follow-up information.  Verbalized understanding and agreement to today's plan.  Bless L Windish was evaluated in Emergency Department on 07/12/2019 for the  symptoms described in the history of present illness. He was evaluated in the context of the global COVID-19 pandemic, which necessitated consideration that the patient might be at risk for infection with the SARS-CoV-2 virus that causes COVID-19. Institutional protocols and algorithms that pertain to the evaluation of patients at risk for COVID-19 are in a state of rapid change based on information released by regulatory bodies including the CDC and federal and state organizations. These policies and algorithms were followed during the patient's care in the ED.   Final Clinical Impressions(s) / ED Diagnoses   Final diagnoses:  Fatigue, unspecified type    ED Discharge Orders    None       Okey Regal, PA-C 07/12/19 1504    Tegeler, Gwenyth Allegra, MD 07/12/19 1626

## 2019-07-17 ENCOUNTER — Telehealth: Payer: Self-pay | Admitting: General Practice

## 2019-07-17 NOTE — Telephone Encounter (Signed)
Pt made aware of negative results, expressed understanding  °

## 2019-08-29 IMAGING — DX DG CHEST 2V
2 series · 2 of 2 positions shown · non-contrast
Comparison: September 17, 2015

CLINICAL DATA: Coughing up blood at work today

EXAM:
CHEST - 2 VIEW

[w chest pa]
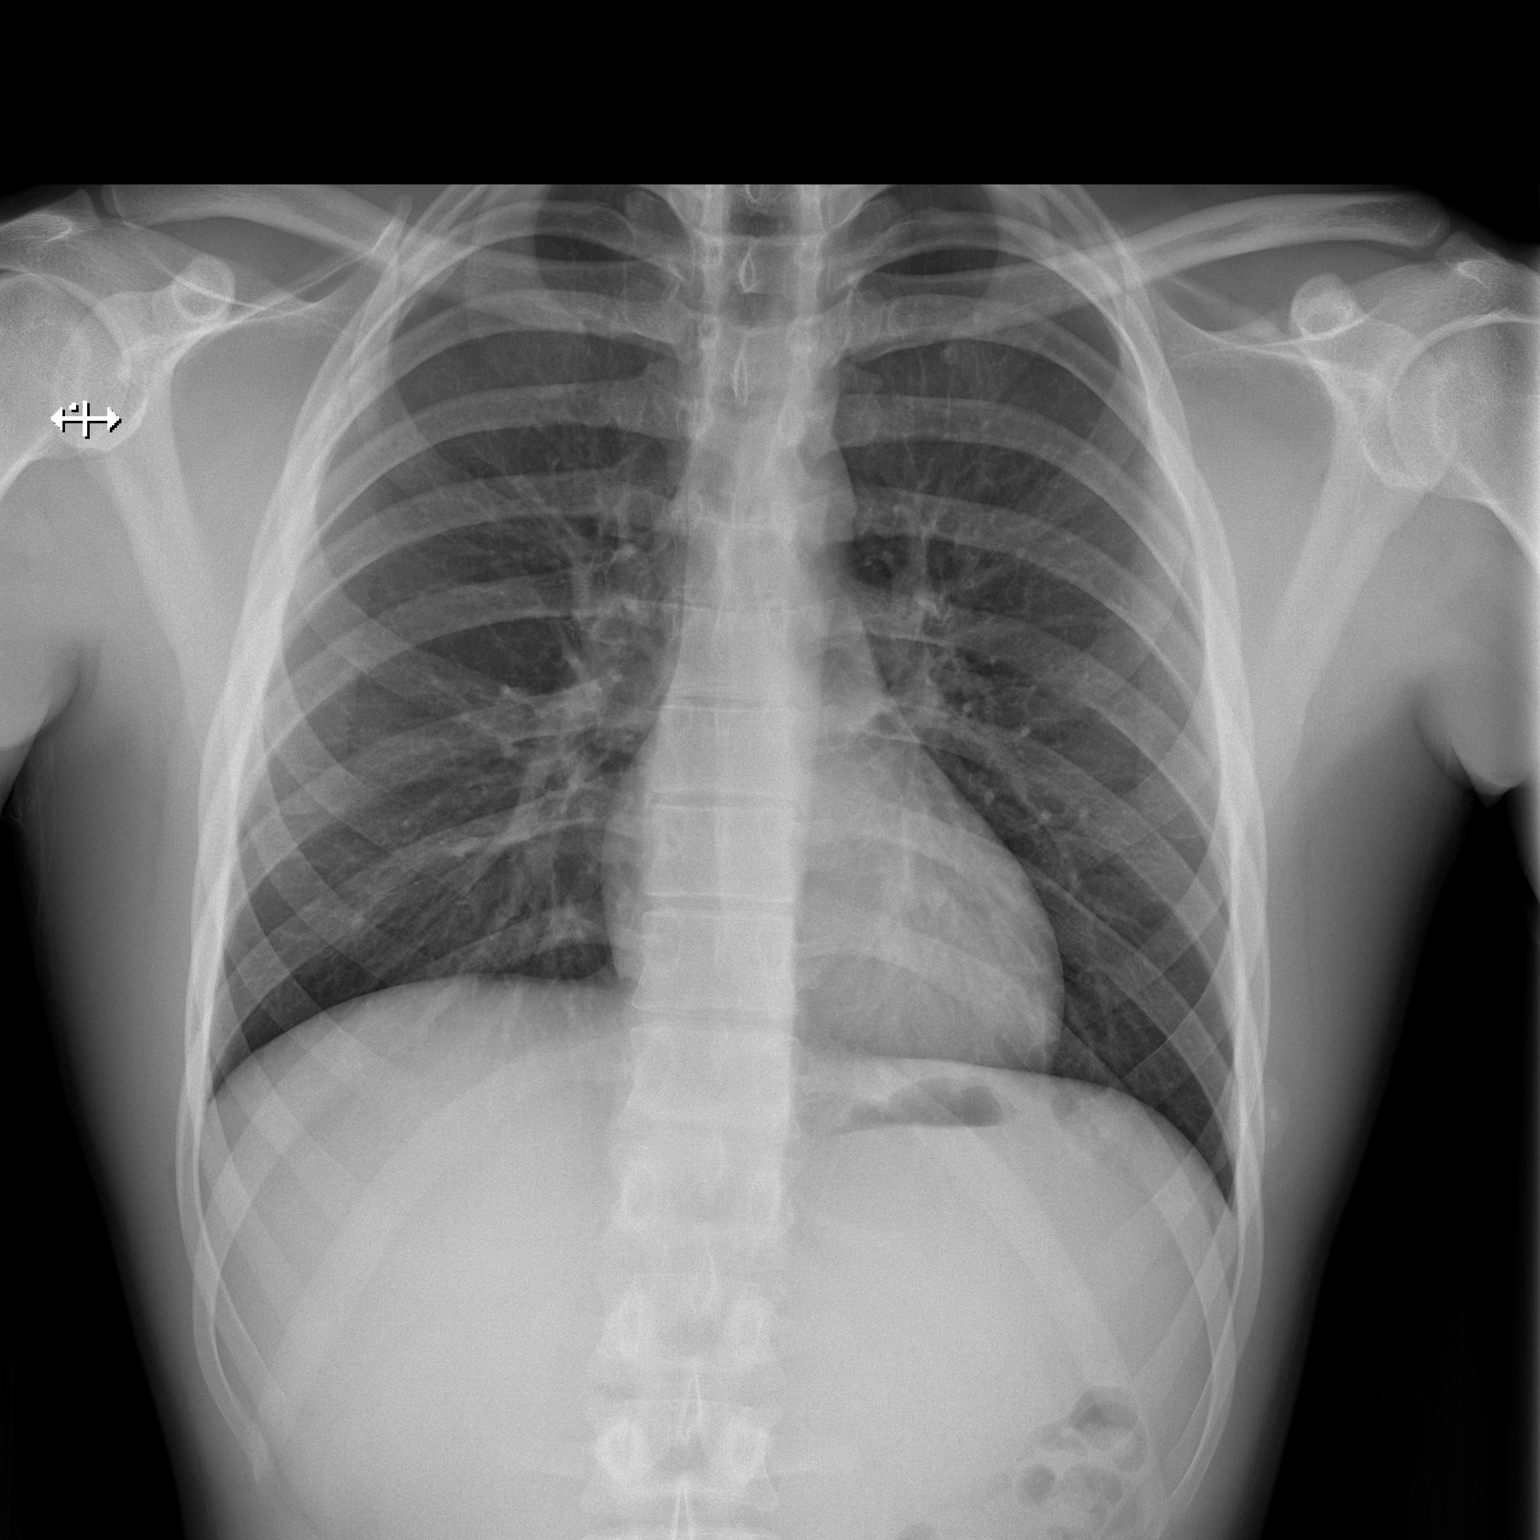

[w chest lat]
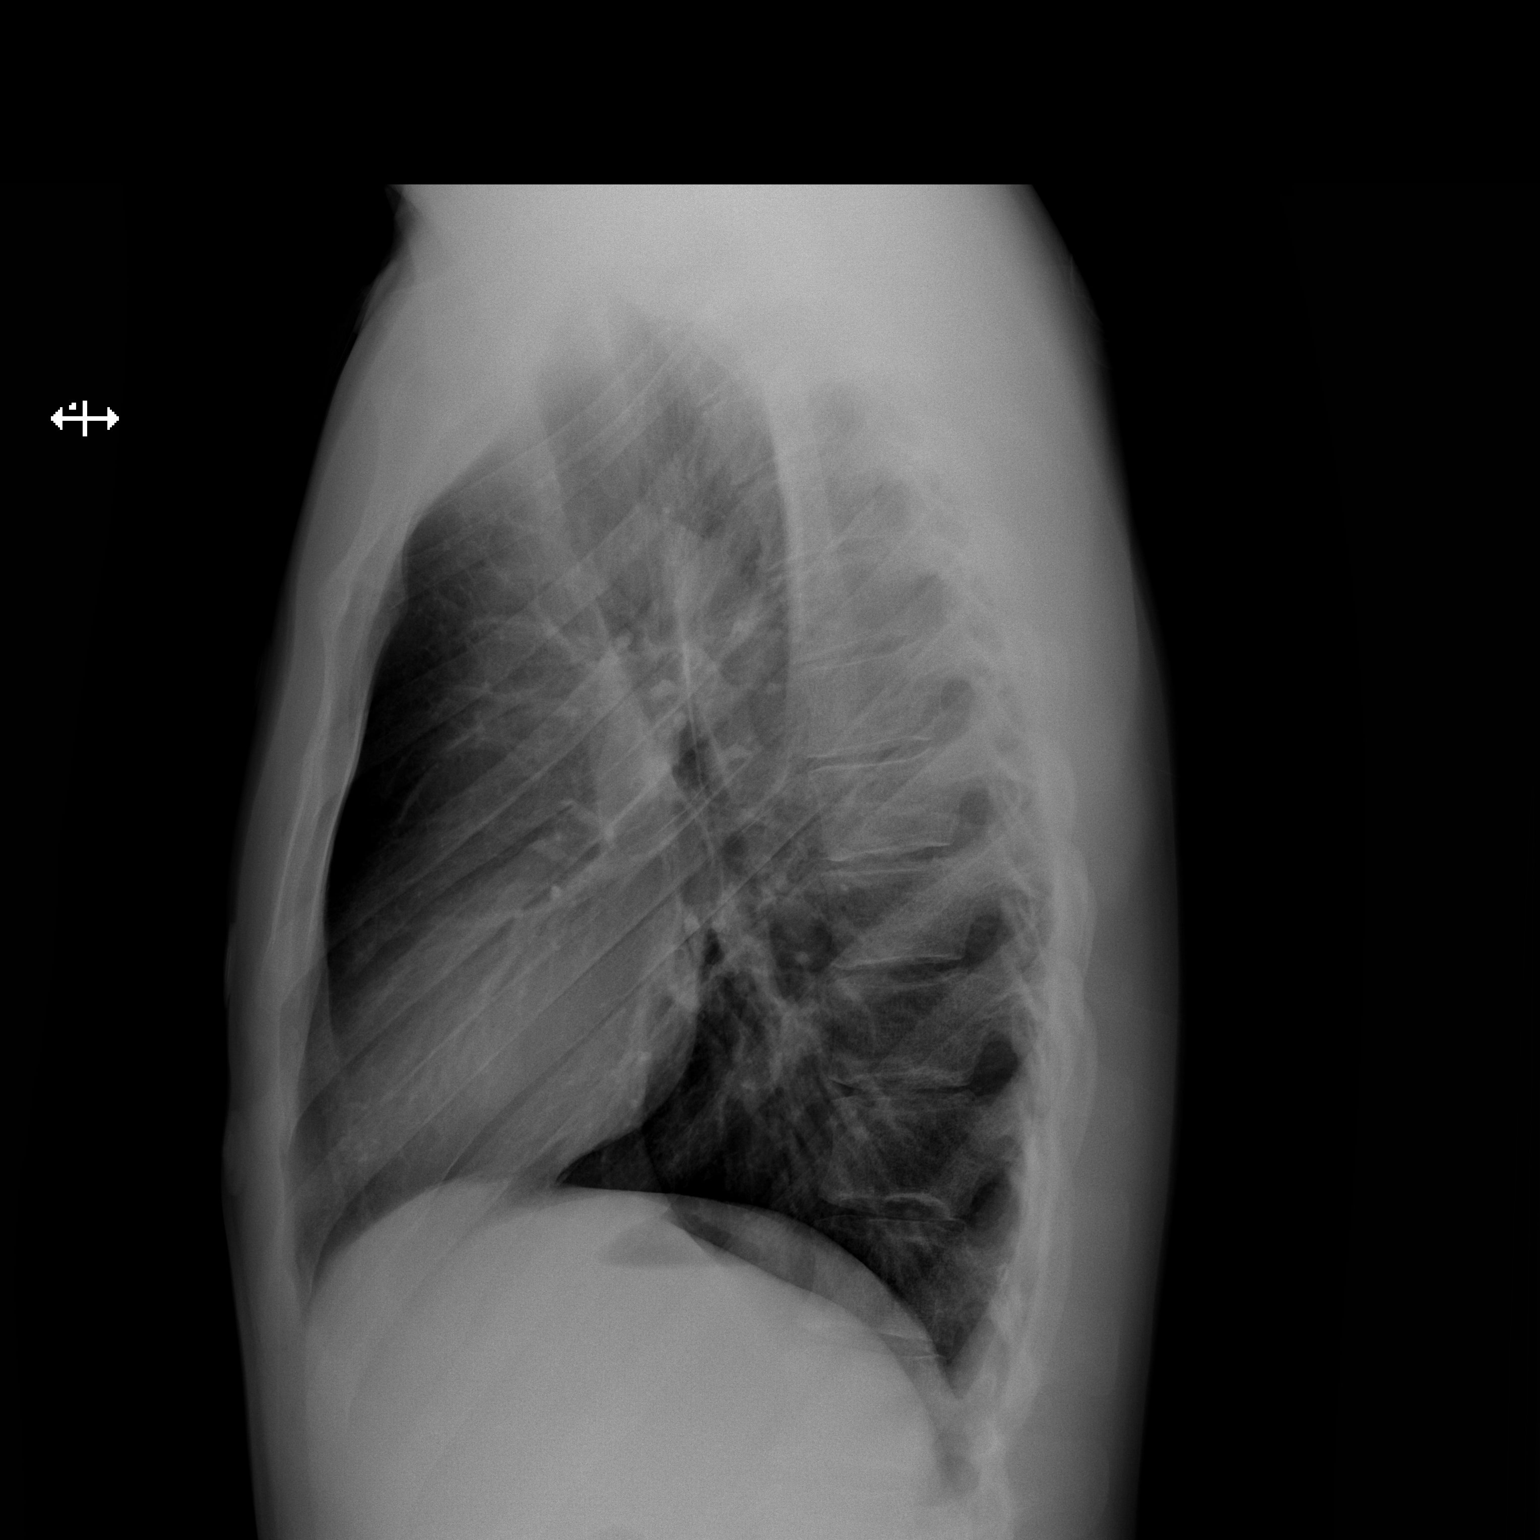

[2 of 2 positions shown; findings below may reference images not displayed]

FINDINGS: The heart size and mediastinal contours are within normal limits.
Both lungs are clear. The visualized skeletal structures are
unremarkable.
IMPRESSION: No active cardiopulmonary disease.

## 2019-12-02 ENCOUNTER — Other Ambulatory Visit: Payer: Self-pay

## 2019-12-02 ENCOUNTER — Encounter (HOSPITAL_COMMUNITY): Payer: Self-pay

## 2019-12-02 ENCOUNTER — Ambulatory Visit (HOSPITAL_COMMUNITY)
Admission: EM | Admit: 2019-12-02 | Discharge: 2019-12-02 | Disposition: A | Discharge status: Home / Self Care | Payer: BC Managed Care – PPO | Attending: Family Medicine | Admitting: Family Medicine

## 2019-12-02 DIAGNOSIS — Z20822 Contact with and (suspected) exposure to covid-19: Secondary | ICD-10-CM

## 2019-12-02 DIAGNOSIS — Z20828 Contact with and (suspected) exposure to other viral communicable diseases: Secondary | ICD-10-CM | POA: Diagnosis not present

## 2019-12-02 DIAGNOSIS — R52 Pain, unspecified: Secondary | ICD-10-CM

## 2019-12-02 DIAGNOSIS — R519 Headache, unspecified: Secondary | ICD-10-CM

## 2019-12-02 NOTE — ED Triage Notes (Signed)
Pt. States for a few days now, he has had headaches & body aches. Wants COVID testing.

## 2019-12-02 NOTE — ED Provider Notes (Signed)
[facility]    CSN: [id number] Arrival date & time: 12/02/19  1244      History   Chief Complaint Chief Complaint  Patient presents with  . Generalized Body Aches  . Headache    HPI Geordie L Berns is a 23 y.o. male.   Patient is an otherwise healthy 23 year old male who presents today with headache and generalized body aches for the past few days.  Symptoms have been constant, waxing waning.  He took 1 Goody powder for headache which relieved his headache.  Exposed by brother who was positive for Covid.  Denies any associated fever, chills, night sweats, diarrhea, loss of taste or smell, sore throat or ear pain.  No cough, chest pain or shortness of breath.  ROS per HPI      History reviewed. No pertinent past medical history.  There are no problems to display for this patient.   History reviewed. No pertinent surgical history.     Home Medications    Prior to Admission medications   Medication Sig Start Date End Date Taking? Authorizing Provider  cetirizine (ZYRTEC ALLERGY) 10 MG tablet Take 1 tablet (10 mg total) by mouth daily. 01/30/18   Sharlene Dory, DO    Family History Family History  Problem Relation Age of Onset  . Migraines Mother   . Asthma Mother   . Hypertension Father   . Diabetes Father     Social History Social History   Tobacco Use  . Smoking status: Former Smoker    Types: E-cigarettes  . Smokeless tobacco: Never Used  Substance Use Topics  . Alcohol use: No  . Drug use: No     Allergies   Patient has no known allergies.   Review of Systems Review of Systems   Physical Exam Triage Vital Signs ED Triage Vitals  Enc Vitals Group     BP 12/02/19 1308 (!) 150/62     Pulse Rate 12/02/19 1308 81     Resp 12/02/19 1308 18     Temp 12/02/19 1308 98.3 F (36.8 C)     Temp Source 12/02/19 1308 Oral     SpO2 12/02/19 1308 99 %     Weight 12/02/19 1305 187 lb 12.8 oz (85.2 kg)     Height --    Head Circumference --      Peak Flow --      Pain Score 12/02/19 1305 0     Pain Loc --      Pain Edu? --      Excl. in GC? --    No data found.  Updated Vital Signs BP (!) 150/62 (BP Location: Left Arm)   Pulse 81   Temp 98.3 F (36.8 C) (Oral)   Resp 18   Wt 187 lb 12.8 oz (85.2 kg)   SpO2 99%   BMI 27.73 kg/m   Visual Acuity Right Eye Distance:   Left Eye Distance:   Bilateral Distance:    Right Eye Near:   Left Eye Near:    Bilateral Near:     Physical Exam Vitals and nursing note reviewed.  Constitutional:      Appearance: Normal appearance.  HENT:     Head: Normocephalic and atraumatic.     Nose: Nose normal.  Eyes:     Conjunctiva/sclera: Conjunctivae normal.  Pulmonary:     Effort: Pulmonary effort is normal.  Musculoskeletal:        General: Normal range of motion.  Cervical back: Normal range of motion.  Skin:    General: Skin is warm and dry.  Neurological:     Mental Status: He is alert.  Psychiatric:        Mood and Affect: Mood normal.      UC Treatments / Results  Labs (all labs ordered are listed, but only abnormal results are displayed) Labs Reviewed  NOVEL CORONAVIRUS, NAA (HOSP ORDER, SEND-OUT TO REF LAB; TAT 18-24 HRS)    EKG   Radiology No results found.  Procedures Procedures (including critical care time)  Medications Ordered in UC Medications - No data to display  Initial Impression / Assessment and Plan / UC Course  I have reviewed the triage vital signs and the nursing notes.  Pertinent labs & imaging results that were available during my care of the patient were reviewed by me and considered in my medical decision making (see chart for details).     Exposure to Covid 19-Covid swab obtained with labs pending Precautions given based on symptoms and exposure Recommended over-the-counter medication as needed for symptoms Follow up as needed for continued or worsening symptoms  Final Clinical Impressions(s) /  UC Diagnoses   Final diagnoses:  Exposure to COVID-19 virus     Discharge Instructions     You can take tylenol or ibuprofen for symptoms.  COVID swab pending and we will call you with any positive results.  Follow up as needed for continued or worsening symptoms     ED Prescriptions    None     PDMP not reviewed this encounter.   Orvan July, NP 12/02/19 1646

## 2019-12-02 NOTE — Discharge Instructions (Signed)
You can take tylenol or ibuprofen for symptoms.  COVID swab pending and we will call you with any positive results.  Follow up as needed for continued or worsening symptoms

## 2019-12-03 LAB — NOVEL CORONAVIRUS, NAA (HOSP ORDER, SEND-OUT TO REF LAB; TAT 18-24 HRS): SARS-CoV-2, NAA: NOT DETECTED

## 2019-12-23 ENCOUNTER — Other Ambulatory Visit: Payer: Self-pay

## 2019-12-23 ENCOUNTER — Emergency Department (HOSPITAL_COMMUNITY)
Admission: EM | Admit: 2019-12-23 | Discharge: 2019-12-23 | Disposition: A | Discharge status: Home / Self Care | Payer: BC Managed Care – PPO | Attending: Emergency Medicine | Admitting: Emergency Medicine

## 2019-12-23 ENCOUNTER — Encounter (HOSPITAL_COMMUNITY): Payer: Self-pay | Admitting: Emergency Medicine

## 2019-12-23 DIAGNOSIS — Z87891 Personal history of nicotine dependence: Secondary | ICD-10-CM | POA: Insufficient documentation

## 2019-12-23 DIAGNOSIS — Z79899 Other long term (current) drug therapy: Secondary | ICD-10-CM | POA: Insufficient documentation

## 2019-12-23 DIAGNOSIS — R5383 Other fatigue: Secondary | ICD-10-CM | POA: Diagnosis not present

## 2019-12-23 DIAGNOSIS — J029 Acute pharyngitis, unspecified: Secondary | ICD-10-CM | POA: Insufficient documentation

## 2019-12-23 DIAGNOSIS — R0981 Nasal congestion: Secondary | ICD-10-CM | POA: Insufficient documentation

## 2019-12-23 DIAGNOSIS — R438 Other disturbances of smell and taste: Secondary | ICD-10-CM | POA: Insufficient documentation

## 2019-12-23 DIAGNOSIS — R05 Cough: Secondary | ICD-10-CM | POA: Insufficient documentation

## 2019-12-23 DIAGNOSIS — Z20822 Contact with and (suspected) exposure to covid-19: Secondary | ICD-10-CM | POA: Diagnosis not present

## 2019-12-23 DIAGNOSIS — J3489 Other specified disorders of nose and nasal sinuses: Secondary | ICD-10-CM | POA: Diagnosis not present

## 2019-12-23 MED ORDER — FLUTICASONE PROPIONATE 50 MCG/ACT NA SUSP: 2.0000 | Freq: Every day | NASAL | 0 refills | Status: DC

## 2019-12-23 MED ORDER — BENZONATATE 100 MG PO CAPS: 100.0000 mg | ORAL_CAPSULE | Freq: Three times a day (TID) | ORAL | 0 refills | Status: AC

## 2019-12-23 NOTE — ED Provider Notes (Signed)
[name] EMERGENCY DEPARTMENT Provider Note   CSN: [id number] Arrival date & time: 12/23/19  1346     History Chief Complaint  Patient presents with  . COVID testing  . Cough  . Nasal Congestion    Darrell Winters is a 24 y.o. male.  HPI   24 year old male presenting for evaluation of rhinorrhea, nasal congestion, sore throat, cough, fatigue, loss of taste and smell that started about 1.5 weeks ago.  Denies any documented fevers at home.  Has been eating and drinking normally.  He is not sure if he has had any Covid exposures.  History reviewed. No pertinent past medical history.  There are no problems to display for this patient.   History reviewed. No pertinent surgical history.     Family History  Problem Relation Age of Onset  . Migraines Mother   . Asthma Mother   . Hypertension Father   . Diabetes Father     Social History   Tobacco Use  . Smoking status: Former Smoker    Types: E-cigarettes  . Smokeless tobacco: Never Used  Substance Use Topics  . Alcohol use: No  . Drug use: No    Home Medications Prior to Admission medications   Medication Sig Start Date End Date Taking? Authorizing Provider  cetirizine (ZYRTEC ALLERGY) 10 MG tablet Take 1 tablet (10 mg total) by mouth daily. 01/30/18   Shelda Pal, DO    Allergies    Patient has no known allergies.  Review of Systems   Review of Systems  Constitutional: Positive for fatigue. Negative for chills and fever.  HENT: Positive for congestion, rhinorrhea and sore throat. Negative for ear pain.   Eyes: Negative for visual disturbance.  Respiratory: Positive for cough. Negative for shortness of breath.   Cardiovascular: Negative for chest pain.  Gastrointestinal: Negative for abdominal pain, constipation, diarrhea, nausea and vomiting.  Genitourinary: Negative for dysuria and hematuria.  Musculoskeletal: Negative for back pain.  Skin: Negative for rash.    Neurological: Negative for headaches.  All other systems reviewed and are negative.   Physical Exam Updated Vital Signs BP (!) 136/103 (BP Location: Left Arm)   Pulse (!) 103   Temp 98 F (36.7 C) (Oral)   Resp 16   SpO2 100%   Physical Exam Vitals and nursing note reviewed.  Constitutional:      Appearance: He is well-developed.  HENT:     Head: Normocephalic and atraumatic.     Nose: Congestion present.     Mouth/Throat:     Pharynx: Oropharynx is clear. Uvula midline. No pharyngeal swelling, oropharyngeal exudate, posterior oropharyngeal erythema or uvula swelling.     Tonsils: No tonsillar exudate or tonsillar abscesses. 1+ on the right. 1+ on the left.  Eyes:     Conjunctiva/sclera: Conjunctivae normal.  Cardiovascular:     Rate and Rhythm: Normal rate and regular rhythm.     Heart sounds: No murmur.  Pulmonary:     Effort: Pulmonary effort is normal. No respiratory distress.     Breath sounds: Normal breath sounds.  Abdominal:     Palpations: Abdomen is soft.     Tenderness: There is no abdominal tenderness.  Musculoskeletal:     Cervical back: Neck supple.  Skin:    General: Skin is warm and dry.  Neurological:     Mental Status: He is alert.     ED Results / Procedures / Treatments   Labs (all labs ordered are listed,  but only abnormal results are displayed) Labs Reviewed  NOVEL CORONAVIRUS, NAA (HOSP ORDER, SEND-OUT TO REF LAB; TAT 18-24 HRS)    EKG None  Radiology No results found.  Procedures Procedures (including critical care time)  Medications Ordered in ED Medications - No data to display  ED Course  I have reviewed the triage vital signs and the nursing notes.  Pertinent labs & imaging results that were available during my care of the patient were reviewed by me and considered in my medical decision making (see chart for details).    MDM Rules/Calculators/A&P                      Patient presenting for evaluation for Covid.   Reports symptoms ongoing for about 10 days.  Patient nontoxic, well-appearing, no distress.  Vital signs are reassuring.  Tested for Covid in the ED. Results pending at the time of discharge. Advised on quarantine measures. Will give Rx for symptomatic management. Advised on f/u and return precautions. Pt voiced understanding of the plan and reasons to return. All questions answered, pt stable for d/c.  ---  Darrell Winters was evaluated in Emergency Department on 12/23/2019 for the symptoms described in the history of present illness. He was evaluated in the context of the global COVID-19 pandemic, which necessitated consideration that the patient might be at risk for infection with the SARS-CoV-2 virus that causes COVID-19. Institutional protocols and algorithms that pertain to the evaluation of patients at risk for COVID-19 are in a state of rapid change based on information released by regulatory bodies including the CDC and federal and state organizations. These policies and algorithms were followed during the patient's care in the ED.    Final Clinical Impression(s) / ED Diagnoses Final diagnoses:  Person under investigation for COVID-19    Rx / DC Orders ED Discharge Orders    None       Karrie Meres, PA-C 12/23/19 1529    Virgina Norfolk, DO 12/24/19 [phone]

## 2019-12-23 NOTE — ED Triage Notes (Signed)
C/o productive cough with dark green phlegm, congestion, loss or taste, and loss of smell x 1 week.  Pt seen on 12/27 at [facility] requesting COVID testing and test was negative.

## 2019-12-23 NOTE — Discharge Instructions (Signed)
You were given fluticasone nasal spray for your nasal congestion. You were also given cough medication. Take as directed. Rotate tylenol and motrin to treat your fevers and body aches. Stay well hydrated.   Today you were were tested for the coronavirus.  The results will be available in the next 2-3 days.  If the results are positive the hospital will contact you.  If they are negative the hospital would not contact you.  You will need to self quarantine until you are aware of your results.  If they are positive you will need to self quarantine as directed below.  You should be isolated for at least 7 days since the onset of your symptoms AND >72 hours after symptoms resolution (absence of fever without the use of fever reducing medication and improvement in respiratory symptoms), whichever is longer  Please follow up with your primary care provider within 5-7 days for re-evaluation of your symptoms. If you do not have a primary care provider, information for a healthcare clinic has been provided for you to make arrangements for follow up care. Please return to the emergency department for any new or worsening symptoms.

## 2019-12-24 LAB — NOVEL CORONAVIRUS, NAA (HOSP ORDER, SEND-OUT TO REF LAB; TAT 18-24 HRS): SARS-CoV-2, NAA: NOT DETECTED

## 2020-10-24 ENCOUNTER — Other Ambulatory Visit: Payer: Self-pay

## 2020-10-24 ENCOUNTER — Encounter (HOSPITAL_COMMUNITY): Payer: Self-pay

## 2020-10-24 ENCOUNTER — Ambulatory Visit (HOSPITAL_COMMUNITY)
Admission: EM | Admit: 2020-10-24 | Discharge: 2020-10-24 | Disposition: A | Discharge status: Home / Self Care | Payer: BC Managed Care – PPO | Attending: Emergency Medicine | Admitting: Emergency Medicine

## 2020-10-24 DIAGNOSIS — J069 Acute upper respiratory infection, unspecified: Secondary | ICD-10-CM

## 2020-10-24 DIAGNOSIS — Z20822 Contact with and (suspected) exposure to covid-19: Secondary | ICD-10-CM | POA: Diagnosis not present

## 2020-10-24 DIAGNOSIS — R0981 Nasal congestion: Secondary | ICD-10-CM | POA: Diagnosis present

## 2020-10-24 DIAGNOSIS — R439 Unspecified disturbances of smell and taste: Secondary | ICD-10-CM | POA: Diagnosis not present

## 2020-10-24 DIAGNOSIS — Z87891 Personal history of nicotine dependence: Secondary | ICD-10-CM | POA: Insufficient documentation

## 2020-10-24 LAB — RESPIRATORY PANEL BY RT PCR (FLU A&B, COVID)
Influenza A by PCR: NEGATIVE
Influenza B by PCR: NEGATIVE
SARS Coronavirus 2 by RT PCR: NEGATIVE

## 2020-10-24 MED ORDER — BENZONATATE 100 MG PO CAPS: 100.0000 mg | ORAL_CAPSULE | Freq: Three times a day (TID) | ORAL | 0 refills | Status: DC | PRN

## 2020-10-24 NOTE — ED Provider Notes (Signed)
[facility]    CSN: [id number] Arrival date & time: 10/24/20  1654      History   Chief Complaint Chief Complaint  Patient presents with   Nasal Congestion   Headache   Fever   Cough    HPI Darrell Winters is a 24 y.o. male.   Travarus L Mesick presents with complaints of cough, nasal drainage, as well as fever this morning. Symptoms originally started around 5 days ago but worse yesterday. Temp of 101. Cough is non productive. Loss of taste and smell. No shortness of breath . His  brothers were ill last week, otherwise no known ill contacts. No gi symptoms. No ear pain. No rash. Took medicine this morning which did help with his temperature, hasn't taken any since. No history of covid-19 and has not received vaccination.   ROS per HPI, negative if not otherwise mentioned.      History reviewed. No pertinent past medical history.  There are no problems to display for this patient.   History reviewed. No pertinent surgical history.     Home Medications    Prior to Admission medications   Medication Sig Start Date End Date Taking? Authorizing Provider  benzonatate (TESSALON) 100 MG capsule Take 1-2 capsules (100-200 mg total) by mouth 3 (three) times daily as needed for cough. 10/24/20   Georgetta Haber, NP  cetirizine (ZYRTEC ALLERGY) 10 MG tablet Take 1 tablet (10 mg total) by mouth daily. 01/30/18   Sharlene Dory, DO  fluticasone (FLONASE) 50 MCG/ACT nasal spray Place 2 sprays into both nostrils daily. 12/23/19   Couture, Cortni S, PA-C    Family History Family History  Problem Relation Age of Onset   Migraines Mother    Asthma Mother    Hypertension Father    Diabetes Father     Social History Social History   Tobacco Use   Smoking status: Former Smoker    Types: E-cigarettes   Smokeless tobacco: Never Used  Substance Use Topics   Alcohol use: No   Drug use: No     Allergies   No known  allergies   Review of Systems Review of Systems   Physical Exam Triage Vital Signs ED Triage Vitals  Enc Vitals Group     BP 10/24/20 1723 135/83     Pulse Rate 10/24/20 1723 91     Resp 10/24/20 1723 18     Temp 10/24/20 1723 99.1 F (37.3 C)     Temp Source 10/24/20 1723 Oral     SpO2 10/24/20 1723 99 %     Weight --      Height --      Head Circumference --      Peak Flow --      Pain Score 10/24/20 1722 0     Pain Loc --      Pain Edu? --      Excl. in GC? --    No data found.  Updated Vital Signs BP 135/83 (BP Location: Left Arm)    Pulse 91    Temp 99.1 F (37.3 C) (Oral)    Resp 18    SpO2 99%   Visual Acuity Right Eye Distance:   Left Eye Distance:   Bilateral Distance:    Right Eye Near:   Left Eye Near:    Bilateral Near:     Physical Exam Constitutional:      Appearance: He is well-developed.  Cardiovascular:  Rate and Rhythm: Normal rate.  Pulmonary:     Effort: Pulmonary effort is normal.     Breath sounds: Normal breath sounds.  Skin:    General: Skin is warm and dry.  Neurological:     Mental Status: He is alert and oriented to person, place, and time.      UC Treatments / Results  Labs (all labs ordered are listed, but only abnormal results are displayed) Labs Reviewed  RESPIRATORY PANEL BY RT PCR (FLU A&B, COVID)    EKG   Radiology No results found.  Procedures Procedures (including critical care time)  Medications Ordered in UC Medications - No data to display  Initial Impression / Assessment and Plan / UC Course  I have reviewed the triage vital signs and the nursing notes.  Pertinent labs & imaging results that were available during my care of the patient were reviewed by me and considered in my medical decision making (see chart for details).     Non toxic. Benign physical exam.  History and physical consistent with viral illness.  Covid testing pending and isolation instructions provided.  Supportive cares  recommended. Return precautions provided. Patient verbalized understanding and agreeable to plan.   Final Clinical Impressions(s) / UC Diagnoses   Final diagnoses:  Viral URI with cough     Discharge Instructions     Push fluids to ensure adequate hydration and keep secretions thin.  Tylenol and/or ibuprofen as needed for pain or fevers.   Tessalon as needed for cough.  Over the counter medications such as mucinex d as needed for cough and mucus.  You may try some vitamins to help your immune system potentially:  Vitamin C 500mg  twice a day. Zinc 50mg  daily. Vitamin D 5000IU daily.   Self isolate until covid results are back and negative.  Will notify you by phone of any positive findings. Your negative results will be sent through your MyChart.     If symptoms worsen or do not improve in the next week to return to be seen or to follow up with your PCP.      ED Prescriptions    Medication Sig Dispense Auth. Provider   benzonatate (TESSALON) 100 MG capsule Take 1-2 capsules (100-200 mg total) by mouth 3 (three) times daily as needed for cough. 21 capsule , NP     PDMP not reviewed this encounter.   , NP 10/24/20 2110

## 2020-10-24 NOTE — ED Triage Notes (Signed)
Pt presents with nasal and chest congestion, coughing, headaches and bodyaches x 5 days. He states his symptoms worsened 11/18. Pt states he had a fever this morning of 101.5. Pt states he took medicine for the fever. Pt denies emesis and nausea. Pt states he does not know if he has had any exposure to someone with COVID. Pt states he feels a lot of weakness and discomfort on his arms and elbows.

## 2020-10-24 NOTE — Discharge Instructions (Signed)
Push fluids to ensure adequate hydration and keep secretions thin.  Tylenol and/or ibuprofen as needed for pain or fevers.   Tessalon as needed for cough.  Over the counter medications such as mucinex d as needed for cough and mucus.  You may try some vitamins to help your immune system potentially:  Vitamin C 500mg  twice a day. Zinc 50mg  daily. Vitamin D 5000IU daily.   Self isolate until covid results are back and negative.  Will notify you by phone of any positive findings. Your negative results will be sent through your MyChart.     If symptoms worsen or do not improve in the next week to return to be seen or to follow up with your PCP.

## 2020-12-02 ENCOUNTER — Encounter (HOSPITAL_COMMUNITY): Payer: Self-pay | Admitting: *Deleted

## 2020-12-02 ENCOUNTER — Ambulatory Visit (HOSPITAL_COMMUNITY)
Admission: EM | Admit: 2020-12-02 | Discharge: 2020-12-02 | Disposition: A | Discharge status: Home / Self Care | Payer: BC Managed Care – PPO | Attending: Family Medicine | Admitting: Family Medicine

## 2020-12-02 ENCOUNTER — Other Ambulatory Visit: Payer: Self-pay

## 2020-12-02 DIAGNOSIS — Z87891 Personal history of nicotine dependence: Secondary | ICD-10-CM | POA: Diagnosis not present

## 2020-12-02 DIAGNOSIS — J209 Acute bronchitis, unspecified: Secondary | ICD-10-CM | POA: Insufficient documentation

## 2020-12-02 DIAGNOSIS — Z20822 Contact with and (suspected) exposure to covid-19: Secondary | ICD-10-CM | POA: Diagnosis not present

## 2020-12-02 MED ORDER — PREDNISONE 20 MG PO TABS: 40.0000 mg | ORAL_TABLET | Freq: Every day | ORAL | 0 refills | Status: AC

## 2020-12-02 MED ORDER — AMOXICILLIN-POT CLAVULANATE 875-125 MG PO TABS: 1.0000 | ORAL_TABLET | Freq: Two times a day (BID) | ORAL | 0 refills | Status: DC

## 2020-12-02 MED ORDER — BENZONATATE 100 MG PO CAPS: 100.0000 mg | ORAL_CAPSULE | Freq: Three times a day (TID) | ORAL | 0 refills | Status: DC

## 2020-12-02 NOTE — ED Triage Notes (Signed)
Pt reports for the past 4-5 days he has had a HA,fever congestion and cough.

## 2020-12-02 NOTE — ED Provider Notes (Addendum)
[facility]    CSN: [id number] Arrival date & time: 12/02/20  1929      History   Chief Complaint Chief Complaint  Patient presents with  . Fever  . Otalgia    Left  . Headache    HPI Darrell Winters is a 24 y.o. male presenting with fever, left earache, headache, congestion, cough x3 days. Earache x1 day. Denies tinnitus, dizziness. Coughing frequently and this is really bothering him. Two episodes of diarrhea yesterday and decreased appetite today. Denies n/v, shortness of breath, chest pain, facial pain, teeth pain, headaches, sore throat, loss of taste/smell, swollen lymph nodes. Not vaccinated for covid-19.  HPI  History reviewed. No pertinent past medical history.  There are no problems to display for this patient.   History reviewed. No pertinent surgical history.     Home Medications    Prior to Admission medications   Medication Sig Start Date End Date Taking? Authorizing Provider  amoxicillin-clavulanate (AUGMENTIN) 875-125 MG tablet Take 1 tablet by mouth every 12 (twelve) hours. 12/02/20  Yes Rhys Martini, PA-C  benzonatate (TESSALON) 100 MG capsule Take 1 capsule (100 mg total) by mouth every 8 (eight) hours. 12/02/20  Yes Rhys Martini, PA-C  predniSONE (DELTASONE) 20 MG tablet Take 2 tablets (40 mg total) by mouth daily for 5 days. 12/02/20 12/07/20 Yes Rhys Martini, PA-C  cetirizine (ZYRTEC ALLERGY) 10 MG tablet Take 1 tablet (10 mg total) by mouth daily. 01/30/18 12/02/20  Sharlene Dory, DO  fluticasone (FLONASE) 50 MCG/ACT nasal spray Place 2 sprays into both nostrils daily. 12/23/19 12/02/20  Couture, Cortni S, PA-C    Family History Family History  Problem Relation Age of Onset  . Migraines Mother   . Asthma Mother   . Hypertension Father   . Diabetes Father     Social History Social History   Tobacco Use  . Smoking status: Former Smoker    Types: E-cigarettes  . Smokeless tobacco: Never Used  Substance  Use Topics  . Alcohol use: No  . Drug use: No     Allergies   No known allergies   Review of Systems Review of Systems  Constitutional: Positive for chills and fever.  HENT: Positive for congestion and ear pain.   Respiratory: Positive for cough.   All other systems reviewed and are negative.    Physical Exam Triage Vital Signs ED Triage Vitals [12/02/20 2024]  Enc Vitals Group     BP      Pulse      Resp      Temp      Temp src      SpO2      Weight      Height      Head Circumference      Peak Flow      Pain Score 7     Pain Loc      Pain Edu?      Excl. in GC?    No data found.  Updated Vital Signs BP 108/71 (BP Location: Left Arm)   Pulse (!) 107   Temp (!) 101.2 F (38.4 C) (Oral)   Resp 20   SpO2 98%   Visual Acuity Right Eye Distance:   Left Eye Distance:   Bilateral Distance:    Right Eye Near:   Left Eye Near:    Bilateral Near:     Physical Exam Vitals reviewed.  Constitutional:      General: He  is not in acute distress.    Appearance: Normal appearance. He is not ill-appearing.  HENT:     Head: Normocephalic and atraumatic.     Right Ear: Hearing, tympanic membrane, ear canal and external ear normal. No swelling or tenderness. There is no impacted cerumen. No mastoid tenderness. Tympanic membrane is not perforated, erythematous, retracted or bulging.     Left Ear: Hearing, ear canal and external ear normal. Tenderness present. No swelling. There is no impacted cerumen. No mastoid tenderness. Tympanic membrane is erythematous. Tympanic membrane is not perforated, retracted or bulging.     Nose:     Right Sinus: No maxillary sinus tenderness or frontal sinus tenderness.     Left Sinus: No maxillary sinus tenderness or frontal sinus tenderness.     Mouth/Throat:     Mouth: Mucous membranes are moist.     Pharynx: Uvula midline. Posterior oropharyngeal erythema present. No oropharyngeal exudate.     Tonsils: No tonsillar exudate.   Cardiovascular:     Rate and Rhythm: Normal rate and regular rhythm.     Heart sounds: Normal heart sounds.  Pulmonary:     Effort: Pulmonary effort is normal. No tachypnea, accessory muscle usage, prolonged expiration, respiratory distress or retractions.     Breath sounds: Normal breath sounds and air entry. No decreased breath sounds, wheezing, rhonchi or rales.     Comments: Coughing frequently Abdominal:     General: Abdomen is flat. Bowel sounds are normal.     Tenderness: There is no abdominal tenderness. There is no guarding or rebound. Negative signs include Murphy's sign, Rovsing's sign and McBurney's sign.  Lymphadenopathy:     Cervical: No cervical adenopathy.  Neurological:     General: No focal deficit present.     Mental Status: He is alert and oriented to person, place, and time.  Psychiatric:        Attention and Perception: Attention and perception normal.        Mood and Affect: Mood and affect normal.        Behavior: Behavior is cooperative.      UC Treatments / Results  Labs (all labs ordered are listed, but only abnormal results are displayed) Labs Reviewed  RESP PANEL BY RT-PCR (FLU A&B, COVID) [id number]    EKG   Radiology No results found.  Procedures Procedures (including critical care time)  Medications Ordered in UC Medications - No data to display  Initial Impression / Assessment and Plan / UC Course  I have reviewed the triage vital signs and the nursing notes.  Pertinent labs & imaging results that were available during my care of the patient were reviewed by me and considered in my medical decision making (see chart for details).     Covid and influenza tests sent today. Patient is not vaccinated for covid-19. Isolation precautions per CDC guidelines until negative result. Symptomatic relief with OTC Mucinex, Nyquil, etc. Return precautions- new/worsening fevers/chills, shortness of breath, chest pain, abd pain, etc.   For AOM,  augmentin as below. For bronchitis, prednisone and tessalon as below. He does not have diabetes.   Final Clinical Impressions(s) / UC Diagnoses   Final diagnoses:  Acute bronchitis, unspecified organism   Discharge Instructions   None    ED Prescriptions    Medication Sig Dispense Auth. Provider   benzonatate (TESSALON) 100 MG capsule Take 1 capsule (100 mg total) by mouth every 8 (eight) hours. 21 capsule Rhys Martini, PA-C   amoxicillin-clavulanate (AUGMENTIN) [phone]  MG tablet Take 1 tablet by mouth every 12 (twelve) hours. 14 tablet Rhys Martini, PA-C   predniSONE (DELTASONE) 20 MG tablet Take 2 tablets (40 mg total) by mouth daily for 5 days. 10 tablet Rhys Martini, PA-C     PDMP not reviewed this encounter.   Rhys Martini, PA-C 12/02/20 2046    Rhys Martini, PA-C 12/02/20 2102

## 2020-12-02 NOTE — Discharge Instructions (Addendum)
For your ear infection, take the antibiotic (Augmentin). Take two pills of this daily- one in the morning and one in the evening. Take it for 7 days.  For your bronchitis, start the steroid. Take 2 pills (40mg ) daily for 5 days. You can also use the Tessalon as needed for cough- every 8 hours as needed.   Use ibuprofen and/or tylenol for fevers/bodyaches.   Drink plenty of fluids, rest, and eat healthy foods as tolerated!

## 2020-12-02 NOTE — ED Triage Notes (Signed)
PT reports he took 2 tylenol tabs at 1700 today . Unsure  What the MG was. Temp 101.2 in triage

## 2020-12-03 LAB — RESP PANEL BY RT-PCR (FLU A&B, COVID) ARPGX2
Influenza A by PCR: NEGATIVE
Influenza B by PCR: NEGATIVE
SARS Coronavirus 2 by RT PCR: NEGATIVE

## 2021-02-08 ENCOUNTER — Ambulatory Visit (HOSPITAL_COMMUNITY)
Admission: EM | Admit: 2021-02-08 | Discharge: 2021-02-08 | Disposition: A | Discharge status: Home / Self Care | Payer: BC Managed Care – PPO | Attending: Urgent Care | Admitting: Urgent Care

## 2021-02-08 ENCOUNTER — Encounter (HOSPITAL_COMMUNITY): Payer: Self-pay

## 2021-02-08 ENCOUNTER — Other Ambulatory Visit: Payer: Self-pay

## 2021-02-08 DIAGNOSIS — J029 Acute pharyngitis, unspecified: Secondary | ICD-10-CM | POA: Diagnosis present

## 2021-02-08 LAB — POCT RAPID STREP A, ED / UC: Streptococcus, Group A Screen (Direct): NEGATIVE

## 2021-02-08 MED ORDER — NAPROXEN 500 MG PO TABS: 500.0000 mg | ORAL_TABLET | Freq: Two times a day (BID) | ORAL | 0 refills | Status: DC

## 2021-02-08 NOTE — ED Provider Notes (Signed)
[facility]   MRN: [id number] DOB: Apr 30, 1996  Subjective:   Darrell Winters is a 25 y.o. male presenting for 1 week history of throat pain. Took course of an antibiotic at the end of December.  Infections is still has.  He has not taken medications for this.  No current facility-administered medications for this encounter.  Current Outpatient Medications:  .  amoxicillin-clavulanate (AUGMENTIN) 875-125 MG tablet, Take 1 tablet by mouth every 12 (twelve) hours., Disp: 14 tablet, Rfl: 0 .  benzonatate (TESSALON) 100 MG capsule, Take 1 capsule (100 mg total) by mouth every 8 (eight) hours., Disp: 21 capsule, Rfl: 0   Allergies  Allergen Reactions  . No Known Allergies     History reviewed. No pertinent past medical history.   History reviewed. No pertinent surgical history.  Family History  Problem Relation Age of Onset  . Migraines Mother   . Asthma Mother   . Hypertension Father   . Diabetes Father     Social History   Tobacco Use  . Smoking status: Former Smoker    Types: E-cigarettes  . Smokeless tobacco: Never Used  Substance Use Topics  . Alcohol use: No  . Drug use: No    ROS   Objective:   Vitals: BP (!) 147/96 (BP Location: Right Arm)   Pulse 87   Temp 99.6 F (37.6 C) (Oral)   Resp 18   SpO2 97%   Physical Exam Constitutional:      General: He is not in acute distress.    Appearance: Normal appearance. He is well-developed and normal weight. He is not ill-appearing, toxic-appearing or diaphoretic.  HENT:     Head: Normocephalic and atraumatic.     Right Ear: External ear normal.     Left Ear: External ear normal.     Nose: Nose normal.     Mouth/Throat:     Pharynx: Oropharynx is clear.   Eyes:     General: No scleral icterus.       Right eye: No discharge.        Left eye: No discharge.     Extraocular Movements: Extraocular movements intact.     Pupils: Pupils are equal, round, and reactive to light.   Cardiovascular:     Rate and Rhythm: Normal rate.  Pulmonary:     Effort: Pulmonary effort is normal.  Musculoskeletal:     Cervical back: Normal range of motion.  Neurological:     Mental Status: He is alert and oriented to person, place, and time.  Psychiatric:        Mood and Affect: Mood normal.        Behavior: Behavior normal.        Thought Content: Thought content normal.        Judgment: Judgment normal.     Results for orders placed or performed during the hospital encounter of 02/08/21 (from the past 24 hour(s))  POCT Rapid Strep A     Status: None   Collection Time: 02/08/21  6:11 PM  Result Value Ref Range   Streptococcus, Group A Screen (Direct) NEGATIVE NEGATIVE    Assessment and Plan :   PDMP not reviewed this encounter.  1. Viral pharyngitis   2. Sore throat     Strep culture pending, recommend supportive care.  Naproxen for pain inflammation. Counseled patient on potential for adverse effects with medications prescribed/recommended today, ER and return-to-clinic precautions discussed, patient verbalized understanding.  Wallis Bamberg, [place] 02/08/21 1821

## 2021-02-08 NOTE — ED Triage Notes (Signed)
Pt presents with sore throat X 1 week. 

## 2021-02-12 LAB — CULTURE, GROUP A STREP (THRC)

## 2021-03-04 ENCOUNTER — Other Ambulatory Visit: Payer: Self-pay

## 2021-03-04 ENCOUNTER — Ambulatory Visit (HOSPITAL_COMMUNITY)
Admission: EM | Admit: 2021-03-04 | Discharge: 2021-03-04 | Disposition: A | Discharge status: Home / Self Care | Payer: BC Managed Care – PPO | Attending: Family Medicine | Admitting: Family Medicine

## 2021-03-04 ENCOUNTER — Encounter (HOSPITAL_COMMUNITY): Payer: Self-pay | Admitting: Emergency Medicine

## 2021-03-04 DIAGNOSIS — J069 Acute upper respiratory infection, unspecified: Secondary | ICD-10-CM | POA: Diagnosis not present

## 2021-03-04 DIAGNOSIS — R051 Acute cough: Secondary | ICD-10-CM | POA: Insufficient documentation

## 2021-03-04 DIAGNOSIS — J302 Other seasonal allergic rhinitis: Secondary | ICD-10-CM | POA: Diagnosis not present

## 2021-03-04 DIAGNOSIS — J3089 Other allergic rhinitis: Secondary | ICD-10-CM

## 2021-03-04 DIAGNOSIS — Z87891 Personal history of nicotine dependence: Secondary | ICD-10-CM | POA: Insufficient documentation

## 2021-03-04 DIAGNOSIS — Z20822 Contact with and (suspected) exposure to covid-19: Secondary | ICD-10-CM | POA: Insufficient documentation

## 2021-03-04 DIAGNOSIS — R509 Fever, unspecified: Secondary | ICD-10-CM

## 2021-03-04 LAB — SARS CORONAVIRUS 2 (TAT 6-24 HRS): SARS Coronavirus 2: NEGATIVE

## 2021-03-04 LAB — POC INFLUENZA A AND B ANTIGEN (URGENT CARE ONLY)
Influenza A Ag: NEGATIVE
Influenza B Ag: NEGATIVE

## 2021-03-04 MED ORDER — IBUPROFEN 800 MG PO TABS
800.0000 mg | ORAL_TABLET | Freq: Once | ORAL | Status: AC
  Administered 2021-03-04: 800 mg via ORAL

## 2021-03-04 MED ORDER — CETIRIZINE HCL 10 MG PO TABS: 10.0000 mg | ORAL_TABLET | Freq: Every day | ORAL | 2 refills | Status: DC

## 2021-03-04 MED ORDER — IBUPROFEN 800 MG PO TABS
ORAL_TABLET | ORAL | Status: AC
  Filled 2021-03-04: qty 1

## 2021-03-04 MED ORDER — FLUTICASONE PROPIONATE 50 MCG/ACT NA SUSP: 1.0000 | Freq: Two times a day (BID) | NASAL | 2 refills | Status: DC

## 2021-03-04 NOTE — ED Provider Notes (Signed)
[facility]    CSN: [id number] Arrival date & time: 03/04/21  4098      History   Chief Complaint Chief Complaint  Patient presents with  . Cough  . Sore Throat  . Generalized Body Aches  . Nasal Congestion    HPI Darrell Winters is a 25 y.o. male.   Presenting today with 4-day history of body aches, fever, sore throat, cough, congestion, fatigue.  Denies chest pain, shortness of breath, abdominal pain, nausea vomiting diarrhea.  States has been sick multiple times in the past few months, seems to improve slightly between illnesses but always stays congestion with a sore throat the last few months.  Has been lately taking Sudafed and DayQuil with minimal relief.  Was just tested 3 weeks ago for strep and both rapid and throat culture negative.  Denies known new sick contacts.  Denies known chronic medical problems.     History reviewed. No pertinent past medical history.  There are no problems to display for this patient.   History reviewed. No pertinent surgical history.     Home Medications    Prior to Admission medications   Medication Sig Start Date End Date Taking? Authorizing Provider  cetirizine (ZYRTEC ALLERGY) 10 MG tablet Take 1 tablet (10 mg total) by mouth daily. 03/04/21  Yes Particia Nearing, PA-C  fluticasone [facility]) 50 MCG/ACT nasal spray Place 1 spray into both nostrils in the morning and at bedtime. 03/04/21  Yes Particia Nearing, PA-C  amoxicillin-clavulanate (AUGMENTIN) 875-125 MG tablet Take 1 tablet by mouth every 12 (twelve) hours. 12/02/20   Rhys Martini, PA-C  benzonatate (TESSALON) 100 MG capsule Take 1 capsule (100 mg total) by mouth every 8 (eight) hours. 12/02/20   Rhys Martini, PA-C  naproxen (NAPROSYN) 500 MG tablet Take 1 tablet (500 mg total) by mouth 2 (two) times daily with a meal. 02/08/21   Wallis Bamberg, PA-C    Family History Family History  Problem Relation Age of Onset  . Migraines Mother   .  Asthma Mother   . Hypertension Father   . Diabetes Father     Social History Social History   Tobacco Use  . Smoking status: Former Smoker    Types: E-cigarettes  . Smokeless tobacco: Never Used  Substance Use Topics  . Alcohol use: No  . Drug use: No     Allergies   No known allergies   Review of Systems Review of Systems Per HPI  Physical Exam Triage Vital Signs ED Triage Vitals  Enc Vitals Group     BP 03/04/21 1018 126/74     Pulse Rate 03/04/21 1018 (!) 102     Resp 03/04/21 1018 18     Temp 03/04/21 1018 (!) 100.9 F (38.3 C)     Temp Source 03/04/21 1018 Oral     SpO2 03/04/21 1018 96 %     Weight --      Height --      Head Circumference --      Peak Flow --      Pain Score 03/04/21 1013 3     Pain Loc --      Pain Edu? --      Excl. in GC? --    No data found.  Updated Vital Signs BP 126/74 (BP Location: Left Arm)   Pulse (!) 102   Temp (!) 100.9 F (38.3 C) (Oral)   Resp 18   SpO2 96%  Visual Acuity Right Eye Distance:   Left Eye Distance:   Bilateral Distance:    Right Eye Near:   Left Eye Near:    Bilateral Near:     Physical Exam Vitals and nursing note reviewed.  Constitutional:      Appearance: Normal appearance.  HENT:     Head: Atraumatic.     Right Ear: Tympanic membrane normal.     Left Ear: Tympanic membrane normal.     Nose: Rhinorrhea present.     Mouth/Throat:     Mouth: Mucous membranes are moist.     Pharynx: Posterior oropharyngeal erythema present. No oropharyngeal exudate.     Comments: Mild to moderate bilateral tonsillar edema and erythema Eyes:     Extraocular Movements: Extraocular movements intact.     Conjunctiva/sclera: Conjunctivae normal.  Cardiovascular:     Rate and Rhythm: Normal rate and regular rhythm.  Pulmonary:     Effort: Pulmonary effort is normal. No respiratory distress.     Breath sounds: Normal breath sounds. No wheezing or rales.  Abdominal:     General: Bowel sounds are normal.  There is no distension.     Palpations: Abdomen is soft.     Tenderness: There is no abdominal tenderness. There is no guarding.  Musculoskeletal:        General: Normal range of motion.     Cervical back: Normal range of motion and neck supple.  Skin:    General: Skin is warm and dry.     Findings: No rash.  Neurological:     General: No focal deficit present.     Mental Status: He is oriented to person, place, and time.     Sensory: No sensory deficit.     Motor: No weakness.     Gait: Gait normal.  Psychiatric:        Mood and Affect: Mood normal.        Thought Content: Thought content normal.        Judgment: Judgment normal.      UC Treatments / Results  Labs (all labs ordered are listed, but only abnormal results are displayed) Labs Reviewed  SARS CORONAVIRUS 2 (TAT 6-24 HRS)  POC INFLUENZA A AND B ANTIGEN (URGENT CARE ONLY)    EKG   Radiology No results found.  Procedures Procedures (including critical care time)  Medications Ordered in UC Medications  ibuprofen (ADVIL) tablet 800 mg (800 mg Oral Given 03/04/21 1025)    Initial Impression / Assessment and Plan / UC Course  I have reviewed the triage vital signs and the nursing notes.  Pertinent labs & imaging results that were available during my care of the patient were reviewed by me and considered in my medical decision making (see chart for details).     Overall well-appearing, febrile and mildly tachycardic in triage.  He had taken Tylenol several hours ago so was given Motrin in clinic for his fever.  Rapid flu test negative, Covid pending, strep test declined.  Do suspect a seasonal allergy component underlying given the chronicity of his sick symptoms.  Will start him on Zyrtec and Flonase for this.  And await Covid results.  Isolation protocol reviewed, work note given.  Return for acutely worsening symptoms.  Continue over-the-counter fever reducers and symptomatic management.  Final Clinical  Impressions(s) / UC Diagnoses   Final diagnoses:  Viral URI with cough  Fever, unspecified  Seasonal allergic rhinitis due to other allergic trigger   Discharge Instructions  None    ED Prescriptions    Medication Sig Dispense Auth. Provider   cetirizine (ZYRTEC ALLERGY) 10 MG tablet Take 1 tablet (10 mg total) by mouth daily. 30 tablet Particia Nearing, PA-C   fluticasone [facility]) 50 MCG/ACT nasal spray Place 1 spray into both nostrils in the morning and at bedtime. 16 g Particia Nearing, [place]     PDMP not reviewed this encounter.   Particia Nearing, [place] 03/04/21 1139

## 2021-03-04 NOTE — ED Triage Notes (Signed)
Pt presents with congestion, sore throat, body aches, sore throat and chills xs 4 days. States has used sudafed and dayquil with no relief.   States took tylenol around 0800 this morning.

## 2021-03-13 ENCOUNTER — Other Ambulatory Visit: Payer: Self-pay

## 2021-03-13 ENCOUNTER — Encounter (HOSPITAL_COMMUNITY): Payer: Self-pay | Admitting: Emergency Medicine

## 2021-03-13 ENCOUNTER — Ambulatory Visit (HOSPITAL_COMMUNITY)
Admission: EM | Admit: 2021-03-13 | Discharge: 2021-03-13 | Disposition: A | Discharge status: Home / Self Care | Payer: BC Managed Care – PPO | Attending: Student | Admitting: Student

## 2021-03-13 DIAGNOSIS — J209 Acute bronchitis, unspecified: Secondary | ICD-10-CM

## 2021-03-13 DIAGNOSIS — J301 Allergic rhinitis due to pollen: Secondary | ICD-10-CM | POA: Diagnosis not present

## 2021-03-13 MED ORDER — ALBUTEROL SULFATE HFA 108 (90 BASE) MCG/ACT IN AERS: 1.0000 | INHALATION_SPRAY | Freq: Four times a day (QID) | RESPIRATORY_TRACT | 0 refills | Status: DC | PRN

## 2021-03-13 MED ORDER — PREDNISONE 10 MG (21) PO TBPK: ORAL_TABLET | Freq: Every day | ORAL | 0 refills | Status: DC

## 2021-03-13 NOTE — ED Triage Notes (Addendum)
Pt presents for follow-up. Pt was seen on 3/30 and not feeling any better. States having productive cough.

## 2021-03-13 NOTE — Discharge Instructions (Addendum)
-  Albuterol inhaler as needed for cough and shortness of breath -Start the prednisone taper. Try taking this in the morning since it can give energy.

## 2021-03-13 NOTE — ED Provider Notes (Signed)
[facility]    CSN: [id number] Arrival date & time: 03/13/21  8453      History   Chief Complaint Chief Complaint  Patient presents with  . Cough  . Sore Throat    HPI Darrell Winters is a 25 y.o. male presenting with cough and sore throat.. This patient has a history of allergic rhinitis for which he is taking Zyrtec and Flonase.  Notes about 1 week of progressively worsening hacking cough that is worse at night.  Describes this as productive.  Notes some sore throat as well.  Denies fever/chills, shortness of breath, dizziness, chest pain, nausea/vomiting/diarrhea, ear pain, sinus pain.  HPI  History reviewed. No pertinent past medical history.  There are no problems to display for this patient.   History reviewed. No pertinent surgical history.     Home Medications    Prior to Admission medications   Medication Sig Start Date End Date Taking? Authorizing Provider  albuterol (VENTOLIN HFA) 108 (90 Base) MCG/ACT inhaler Inhale 1-2 puffs into the lungs every 6 (six) hours as needed for wheezing or shortness of breath. 03/13/21  Yes Rhys Martini, PA-C  predniSONE (STERAPRED UNI-PAK 21 TAB) 10 MG (21) TBPK tablet Take by mouth daily. Take 6 tabs by mouth daily  for 2 days, then 5 tabs for 2 days, then 4 tabs for 2 days, then 3 tabs for 2 days, 2 tabs for 2 days, then 1 tab by mouth daily for 2 days 03/13/21  Yes Rhys Martini, PA-C  amoxicillin-clavulanate (AUGMENTIN) 875-125 MG tablet Take 1 tablet by mouth every 12 (twelve) hours. 12/02/20   Rhys Martini, PA-C  benzonatate (TESSALON) 100 MG capsule Take 1 capsule (100 mg total) by mouth every 8 (eight) hours. 12/02/20   Rhys Martini, PA-C  cetirizine (ZYRTEC ALLERGY) 10 MG tablet Take 1 tablet (10 mg total) by mouth daily. 03/04/21   Particia Nearing, PA-C  fluticasone [facility]) 50 MCG/ACT nasal spray Place 1 spray into both nostrils in the morning and at bedtime. 03/04/21   Particia Nearing,  PA-C  naproxen (NAPROSYN) 500 MG tablet Take 1 tablet (500 mg total) by mouth 2 (two) times daily with a meal. 02/08/21   Wallis Bamberg, PA-C    Family History Family History  Problem Relation Age of Onset  . Migraines Mother   . Asthma Mother   . Hypertension Father   . Diabetes Father     Social History Social History   Tobacco Use  . Smoking status: Former Smoker    Types: E-cigarettes  . Smokeless tobacco: Never Used  Substance Use Topics  . Alcohol use: No  . Drug use: No     Allergies   No known allergies   Review of Systems Review of Systems  Constitutional: Negative for appetite change, chills and fever.  HENT: Negative for congestion, ear pain, rhinorrhea, sinus pressure, sinus pain and sore throat.   Eyes: Negative for redness and visual disturbance.  Respiratory: Positive for cough. Negative for chest tightness, shortness of breath and wheezing.   Cardiovascular: Negative for chest pain and palpitations.  Gastrointestinal: Negative for abdominal pain, constipation, diarrhea, nausea and vomiting.  Genitourinary: Negative for dysuria, frequency and urgency.  Musculoskeletal: Negative for myalgias.  Neurological: Negative for dizziness, weakness and headaches.  Psychiatric/Behavioral: Negative for confusion.  All other systems reviewed and are negative.    Physical Exam Triage Vital Signs ED Triage Vitals  Enc Vitals Group  BP 03/13/21 0853 139/84     Pulse Rate 03/13/21 0853 95     Resp 03/13/21 0853 17     Temp 03/13/21 0853 98.4 F (36.9 C)     Temp Source 03/13/21 0853 Temporal     SpO2 03/13/21 0853 96 %     Weight --      Height --      Head Circumference --      Peak Flow --      Pain Score 03/13/21 0849 0     Pain Loc --      Pain Edu? --      Excl. in GC? --    No data found.  Updated Vital Signs BP 139/84 (BP Location: Right Arm)   Pulse 95   Temp 98.4 F (36.9 C) (Temporal)   Resp 17   SpO2 96%   Visual Acuity Right Eye  Distance:   Left Eye Distance:   Bilateral Distance:    Right Eye Near:   Left Eye Near:    Bilateral Near:     Physical Exam Vitals reviewed.  Constitutional:      General: He is not in acute distress.    Appearance: Normal appearance. He is not ill-appearing.  HENT:     Head: Normocephalic and atraumatic.     Right Ear: Hearing, tympanic membrane, ear canal and external ear normal. No swelling or tenderness. There is no impacted cerumen. No mastoid tenderness. Tympanic membrane is not perforated, erythematous, retracted or bulging.     Left Ear: Hearing, tympanic membrane, ear canal and external ear normal. No swelling or tenderness. There is no impacted cerumen. No mastoid tenderness. Tympanic membrane is not perforated, erythematous, retracted or bulging.     Nose:     Right Sinus: No maxillary sinus tenderness or frontal sinus tenderness.     Left Sinus: No maxillary sinus tenderness or frontal sinus tenderness.     Mouth/Throat:     Mouth: Mucous membranes are moist.     Pharynx: Uvula midline. No oropharyngeal exudate or posterior oropharyngeal erythema.     Tonsils: No tonsillar exudate.  Cardiovascular:     Rate and Rhythm: Normal rate and regular rhythm.     Heart sounds: Normal heart sounds.  Pulmonary:     Breath sounds: Normal breath sounds and air entry. No decreased breath sounds, wheezing, rhonchi or rales.     Comments: Frequent hacking cough Chest:     Chest wall: No tenderness.  Abdominal:     General: Abdomen is flat. Bowel sounds are normal.     Tenderness: There is no abdominal tenderness. There is no guarding or rebound.  Lymphadenopathy:     Cervical: No cervical adenopathy.  Skin:    Capillary Refill: Capillary refill takes less than 2 seconds.  Neurological:     General: No focal deficit present.     Mental Status: He is alert and oriented to person, place, and time.  Psychiatric:        Attention and Perception: Attention and perception normal.         Mood and Affect: Mood and affect normal.        Behavior: Behavior normal. Behavior is cooperative.        Thought Content: Thought content normal.        Judgment: Judgment normal.      UC Treatments / Results  Labs (all labs ordered are listed, but only abnormal results are displayed) Labs Reviewed - No  data to display  EKG   Radiology No results found.  Procedures Procedures (including critical care time)  Medications Ordered in UC Medications - No data to display  Initial Impression / Assessment and Plan / UC Course  I have reviewed the triage vital signs and the nursing notes.  Pertinent labs & imaging results that were available during my care of the patient were reviewed by me and considered in my medical decision making (see chart for details).     This patient is a 25 year old male presenting with acute bronchitis and allergic rhinitis. Today this pt is afebrile nontachycardic nontachypneic, oxygenating well on room air, no wheezes rhonchi or rales.   This patient has been tested for Covid and flu, both of which were negative.  Declined strep.  Already taking Flonase and Zyrtec for allergic rhinitis component.  Start prednisone taper and albuterol.  ED return precautions discussed.  This chart was dictated using voice recognition software, Dragon.  Final Clinical Impressions(s) / UC Diagnoses   Final diagnoses:  Acute bronchitis, unspecified organism  Seasonal allergic rhinitis due to pollen     Discharge Instructions     -Albuterol inhaler as needed for cough and shortness of breath -Start the prednisone taper. Try taking this in the morning since it can give energy.      ED Prescriptions    Medication Sig Dispense Auth. Provider   predniSONE (STERAPRED UNI-PAK 21 TAB) 10 MG (21) TBPK tablet Take by mouth daily. Take 6 tabs by mouth daily  for 2 days, then 5 tabs for 2 days, then 4 tabs for 2 days, then 3 tabs for 2 days, 2 tabs for 2 days,  then 1 tab by mouth daily for 2 days 42 tablet Rhys Martini, PA-C   albuterol (VENTOLIN HFA) 108 (90 Base) MCG/ACT inhaler Inhale 1-2 puffs into the lungs every 6 (six) hours as needed for wheezing or shortness of breath. 1 each Rhys Martini, PA-C     PDMP not reviewed this encounter.   Rhys Martini, PA-C 03/13/21 [phone]

## 2021-07-03 ENCOUNTER — Other Ambulatory Visit: Payer: Self-pay

## 2021-07-03 ENCOUNTER — Ambulatory Visit (HOSPITAL_COMMUNITY)
Admission: EM | Admit: 2021-07-03 | Discharge: 2021-07-03 | Disposition: A | Discharge status: Home / Self Care | Payer: BC Managed Care – PPO | Attending: Medical Oncology | Admitting: Medical Oncology

## 2021-07-03 ENCOUNTER — Encounter (HOSPITAL_COMMUNITY): Payer: Self-pay | Admitting: Emergency Medicine

## 2021-07-03 DIAGNOSIS — J069 Acute upper respiratory infection, unspecified: Secondary | ICD-10-CM

## 2021-07-03 DIAGNOSIS — U071 COVID-19: Secondary | ICD-10-CM | POA: Insufficient documentation

## 2021-07-03 DIAGNOSIS — Z791 Long term (current) use of non-steroidal anti-inflammatories (NSAID): Secondary | ICD-10-CM | POA: Diagnosis not present

## 2021-07-03 LAB — CBC WITH DIFFERENTIAL/PLATELET
Abs Immature Granulocytes: 0.02 10*3/uL (ref 0.00–0.07)
Basophils Absolute: 0 10*3/uL (ref 0.0–0.1)
Basophils Relative: 0 %
Eosinophils Absolute: 0 10*3/uL (ref 0.0–0.5)
Eosinophils Relative: 0 %
HCT: 45.9 % (ref 39.0–52.0)
Hemoglobin: 15.5 g/dL (ref 13.0–17.0)
Immature Granulocytes: 0 %
Lymphocytes Relative: 5 %
Lymphs Abs: 0.4 10*3/uL — ABNORMAL LOW (ref 0.7–4.0)
MCH: 30.3 pg (ref 26.0–34.0)
MCHC: 33.8 g/dL (ref 30.0–36.0)
MCV: 89.6 fL (ref 80.0–100.0)
Monocytes Absolute: 1.4 10*3/uL — ABNORMAL HIGH (ref 0.1–1.0)
Monocytes Relative: 18 %
Neutro Abs: 5.7 10*3/uL (ref 1.7–7.7)
Neutrophils Relative %: 77 %
Platelets: 199 10*3/uL (ref 150–400)
RBC: 5.12 MIL/uL (ref 4.22–5.81)
RDW: 11.7 % (ref 11.5–15.5)
WBC: 7.5 10*3/uL (ref 4.0–10.5)
nRBC: 0 % (ref 0.0–0.2)

## 2021-07-03 LAB — HIV ANTIBODY (ROUTINE TESTING W REFLEX): HIV Screen 4th Generation wRfx: NONREACTIVE

## 2021-07-03 LAB — POC INFLUENZA A AND B ANTIGEN (URGENT CARE ONLY)
INFLUENZA A ANTIGEN, POC: NEGATIVE
INFLUENZA B ANTIGEN, POC: NEGATIVE

## 2021-07-03 MED ORDER — FLUTICASONE PROPIONATE 50 MCG/ACT NA SUSP: 2.0000 | Freq: Every day | NASAL | 0 refills | Status: DC

## 2021-07-03 MED ORDER — BENZONATATE 100 MG PO CAPS: 100.0000 mg | ORAL_CAPSULE | Freq: Three times a day (TID) | ORAL | 0 refills | Status: DC

## 2021-07-03 MED ORDER — IBUPROFEN 800 MG PO TABS
ORAL_TABLET | ORAL | Status: AC
  Filled 2021-07-03: qty 1

## 2021-07-03 MED ORDER — IBUPROFEN 800 MG PO TABS
800.0000 mg | ORAL_TABLET | Freq: Once | ORAL | Status: AC
  Administered 2021-07-03: 800 mg via ORAL

## 2021-07-03 NOTE — ED Triage Notes (Signed)
Pt presents with fever, sore throat, cough, congestion, and severe body aches. States has been taking Tylenol and mucinex. Last dose of Tylenol approx. 4 hours ago.

## 2021-07-03 NOTE — ED Provider Notes (Signed)
[facility]    CSN: [id number] Arrival date & time: 07/03/21  1844      History   Chief Complaint Chief Complaint  Patient presents with   Fever   Generalized Body Aches   Cough   Headache   Nasal Congestion    HPI Darrell Winters is a 25 y.o. male.   HPI  Cold symptoms: Patient states that he has had a 1 day history of fever, body aches, dry cough, headache and nasal congestion.  Mild diarrhea without nausea or constipation or abdominal pain.  He has not tried anything yet for symptoms.  No known sick contacts.  Of note he has had frequent fairly significant viral illnesses over the course of the last year.  History reviewed. No pertinent past medical history.  There are no problems to display for this patient.   History reviewed. No pertinent surgical history.     Home Medications    Prior to Admission medications   Medication Sig Start Date End Date Taking? Authorizing Provider  albuterol (VENTOLIN HFA) 108 (90 Base) MCG/ACT inhaler Inhale 1-2 puffs into the lungs every 6 (six) hours as needed for wheezing or shortness of breath. 03/13/21   Rhys Martini, PA-C  amoxicillin-clavulanate (AUGMENTIN) 875-125 MG tablet Take 1 tablet by mouth every 12 (twelve) hours. 12/02/20   Rhys Martini, PA-C  benzonatate (TESSALON) 100 MG capsule Take 1 capsule (100 mg total) by mouth every 8 (eight) hours. 12/02/20   Rhys Martini, PA-C  cetirizine (ZYRTEC ALLERGY) 10 MG tablet Take 1 tablet (10 mg total) by mouth daily. 03/04/21   Particia Nearing, PA-C  fluticasone [facility]) 50 MCG/ACT nasal spray Place 1 spray into both nostrils in the morning and at bedtime. 03/04/21   Particia Nearing, PA-C  naproxen (NAPROSYN) 500 MG tablet Take 1 tablet (500 mg total) by mouth 2 (two) times daily with a meal. 02/08/21   Wallis Bamberg, PA-C  predniSONE (STERAPRED UNI-PAK 21 TAB) 10 MG (21) TBPK tablet Take by mouth daily. Take 6 tabs by mouth daily  for 2 days, then  5 tabs for 2 days, then 4 tabs for 2 days, then 3 tabs for 2 days, 2 tabs for 2 days, then 1 tab by mouth daily for 2 days 03/13/21   Rhys Martini, PA-C    Family History Family History  Problem Relation Age of Onset   Migraines Mother    Asthma Mother    Hypertension Father    Diabetes Father     Social History Social History   Tobacco Use   Smoking status: Former    Types: E-cigarettes   Smokeless tobacco: Never  Substance Use Topics   Alcohol use: No   Drug use: No     Allergies   No known allergies   Review of Systems Review of Systems  As stated above in HPI Physical Exam Triage Vital Signs ED Triage Vitals  Enc Vitals Group     BP 07/03/21 1924 108/82     Pulse Rate 07/03/21 1924 (!) 109     Resp 07/03/21 1924 18     Temp 07/03/21 1924 (!) 101 F (38.3 C)     Temp Source 07/03/21 1924 Oral     SpO2 07/03/21 1924 98 %     Weight --      Height --      Head Circumference --      Peak Flow --  Pain Score 07/03/21 1921 7     Pain Loc --      Pain Edu? --      Excl. in GC? --    No data found.  Updated Vital Signs BP 108/82 (BP Location: Right Arm)   Pulse (!) 109   Temp (!) 101 F (38.3 C) (Oral)   Resp 18   SpO2 98%   Physical Exam Vitals and nursing note reviewed.  Constitutional:      General: He is not in acute distress.    Appearance: He is well-developed. He is not ill-appearing, toxic-appearing or diaphoretic.  HENT:     Head: Normocephalic and atraumatic.     Nose: Nose normal.     Mouth/Throat:     Mouth: Mucous membranes are moist.     Pharynx: Oropharynx is clear. No oropharyngeal exudate or posterior oropharyngeal erythema.  Eyes:     Extraocular Movements: Extraocular movements intact.     Right eye: Normal extraocular motion and no nystagmus.     Left eye: Normal extraocular motion and no nystagmus.     Pupils: Pupils are equal, round, and reactive to light.     Right eye: Pupil is round and reactive.     Left eye:  Pupil is round and reactive.  Cardiovascular:     Rate and Rhythm: Normal rate and regular rhythm.     Heart sounds: Normal heart sounds.  Pulmonary:     Effort: Pulmonary effort is normal.     Breath sounds: Normal breath sounds.  Abdominal:     Palpations: Abdomen is soft.  Musculoskeletal:     Cervical back: Normal range of motion and neck supple. No rigidity.  Lymphadenopathy:     Cervical: No cervical adenopathy.  Skin:    General: Skin is warm.  Neurological:     Mental Status: He is alert and oriented to person, place, and time.     UC Treatments / Results  Labs (all labs ordered are listed, but only abnormal results are displayed) Labs Reviewed  SARS CORONAVIRUS 2 (TAT 6-24 HRS)  CBC WITH DIFFERENTIAL/PLATELET  HIV ANTIBODY (ROUTINE TESTING W REFLEX)  POC INFLUENZA A AND B ANTIGEN (URGENT CARE ONLY)    EKG   Radiology No results found.  Procedures Procedures (including critical care time)  Medications Ordered in UC Medications  ibuprofen (ADVIL) tablet 800 mg (800 mg Oral Given 07/03/21 1933)    Initial Impression / Assessment and Plan / UC Course  I have reviewed the triage vital signs and the nursing notes.  Pertinent labs & imaging results that were available during my care of the patient were reviewed by me and considered in my medical decision making (see chart for details).     New.  I discussed my concerns with patient regarding his frequent fairly significant viral illness this without known diagnoses.  I have recommended HIV testing and a CBC which she is agreeable with.  For now sending in [place], [place] and he will rest and hydrate.  COVID and influenza testing pending. Final Clinical Impressions(s) / UC Diagnoses   Final diagnoses:  URI with cough and congestion   Discharge Instructions   None    ED Prescriptions   None    PDMP not reviewed this encounter.   Rushie Chestnut, Cordelia Poche 07/03/21 2006

## 2021-07-04 LAB — SARS CORONAVIRUS 2 (TAT 6-24 HRS): SARS Coronavirus 2: POSITIVE — AB

## 2021-11-28 ENCOUNTER — Telehealth: Payer: BC Managed Care – PPO | Admitting: Nurse Practitioner

## 2021-11-28 DIAGNOSIS — R6889 Other general symptoms and signs: Secondary | ICD-10-CM

## 2021-11-28 MED ORDER — PSEUDOEPH-BROMPHEN-DM 30-2-10 MG/5ML PO SYRP: 5.0000 mL | ORAL_SOLUTION | Freq: Four times a day (QID) | ORAL | 0 refills | Status: DC | PRN

## 2021-11-28 MED ORDER — OSELTAMIVIR PHOSPHATE 75 MG PO CAPS: 75.0000 mg | ORAL_CAPSULE | Freq: Two times a day (BID) | ORAL | 0 refills | Status: AC

## 2021-11-28 MED ORDER — IBUPROFEN 600 MG PO TABS: 600.0000 mg | ORAL_TABLET | Freq: Three times a day (TID) | ORAL | 0 refills | Status: DC | PRN

## 2021-11-28 NOTE — Progress Notes (Signed)
I have spent 5 minutes in review of e-visit questionnaire, review and updating patient chart, medical decision making and response to patient.  ° °Sapna Padron W Marlena Barbato, NP ° °  °

## 2021-11-28 NOTE — Addendum Note (Signed)
Addended by: Bertram Denver on: 11/28/2021 10:26 AM   Modules accepted: Orders

## 2021-11-28 NOTE — Progress Notes (Signed)
E visit for Flu like symptoms   We are sorry that you are not feeling well.  Here is how we plan to help! Based on what you have shared with me it looks like you may have a respiratory virus that may be influenza.  Influenza or the flu is   an infection caused by a respiratory virus. The flu virus is highly contagious and persons who did not receive their yearly flu vaccination may catch the flu from close contact.  We have anti-viral medications to treat the viruses that cause this infection. They are not a cure and only shorten the course of the infection. These prescriptions are most effective when they are given within the first 2 days of flu symptoms. Antiviral medication are indicated if you have a high risk of complications from the flu. You should  also consider an antiviral medication if you are in close contact with someone who is at risk. These medications can help patients avoid complications from the flu  but have side effects that you should know. Possible side effects from Tamiflu or oseltamivir include nausea, vomiting, diarrhea, dizziness, headaches, eye redness, sleep problems or other respiratory symptoms. You should not take Tamiflu if you have an allergy to oseltamivir or any to the ingredients in Tamiflu.  Based upon your symptoms and potential risk factors I have prescribed Oseltamivir (Tamiflu).  It has been sent to your designated pharmacy.  You will take one 75 mg capsule orally twice a day for the next 5 days. I have also sent a cough syrup. If you have any additional episodes of coughing up blood you need to be seen for a face to face visit at an urgent care, emergency room or with your PCP.   ANYONE WHO HAS FLU SYMPTOMS SHOULD: Stay home. The flu is highly contagious and going out or to work exposes others! Be sure to drink plenty of fluids. Water is fine as well as fruit juices, sodas and electrolyte beverages. You may want to stay away from caffeine or alcohol.  If you are nauseated, try taking small sips of liquids. How do you know if you are getting enough fluid? Your urine should be a pale yellow or almost colorless. Get rest. Taking a steamy shower or using a humidifier may help nasal congestion and ease sore throat pain. Using a saline nasal spray works much the same way. Cough drops, hard candies and sore throat lozenges may ease your cough. Line up a caregiver. Have someone check on you regularly.   GET HELP RIGHT AWAY IF: You cannot keep down liquids or your medications. You become short of breath Your fell like you are going to pass out or loose consciousness. Your symptoms persist after you have completed your treatment plan MAKE SURE YOU  Understand these instructions. Will watch your condition. Will get help right away if you are not doing well or get worse.  Your e-visit answers were reviewed by a board certified advanced clinical practitioner to complete your personal care plan.  Depending on the condition, your plan could have included both over the counter or prescription medications.  If there is a problem please reply  once you have received a response from your provider.  Your safety is important to [place].  If you have drug allergies check your prescription carefully.    You can use MyChart to ask questions about todays visit, request a non-urgent call back, or ask for a work or school excuse for 24 hours  related to this e-Visit. If it has been greater than 24 hours you will need to follow up with your provider, or enter a new e-Visit to address those concerns.  You will get an e-mail in the next two days asking about your experience.  I hope that your e-visit has been valuable and will speed your recovery. Thank you for using e-visits.

## 2022-02-25 ENCOUNTER — Other Ambulatory Visit: Payer: Self-pay

## 2022-02-25 ENCOUNTER — Ambulatory Visit
Admission: EM | Admit: 2022-02-25 | Discharge: 2022-02-25 | Disposition: A | Discharge status: Home / Self Care | Payer: BC Managed Care – PPO | Attending: Emergency Medicine | Admitting: Emergency Medicine

## 2022-02-25 DIAGNOSIS — J45901 Unspecified asthma with (acute) exacerbation: Secondary | ICD-10-CM | POA: Diagnosis not present

## 2022-02-25 DIAGNOSIS — J9801 Acute bronchospasm: Secondary | ICD-10-CM

## 2022-02-25 DIAGNOSIS — R051 Acute cough: Secondary | ICD-10-CM | POA: Diagnosis not present

## 2022-02-25 DIAGNOSIS — R109 Unspecified abdominal pain: Secondary | ICD-10-CM | POA: Diagnosis not present

## 2022-02-25 DIAGNOSIS — J302 Other seasonal allergic rhinitis: Secondary | ICD-10-CM

## 2022-02-25 LAB — POCT URINALYSIS DIP (MANUAL ENTRY)
Blood, UA: NEGATIVE
Glucose, UA: NEGATIVE mg/dL
Ketones, POC UA: NEGATIVE mg/dL
Leukocytes, UA: NEGATIVE
Nitrite, UA: NEGATIVE
Protein Ur, POC: NEGATIVE mg/dL
Spec Grav, UA: 1.02 (ref 1.010–1.025)
Urobilinogen, UA: 1 E.U./dL
pH, UA: 7.5 (ref 5.0–8.0)

## 2022-02-25 MED ORDER — ALBUTEROL SULFATE HFA 108 (90 BASE) MCG/ACT IN AERS: 2.0000 | INHALATION_SPRAY | Freq: Four times a day (QID) | RESPIRATORY_TRACT | 5 refills | Status: DC | PRN

## 2022-02-25 MED ORDER — MONTELUKAST SODIUM 10 MG PO TABS: 10.0000 mg | ORAL_TABLET | Freq: Every day | ORAL | 1 refills | Status: AC

## 2022-02-25 MED ORDER — ALBUTEROL SULFATE (2.5 MG/3ML) 0.083% IN NEBU
2.5000 mg | INHALATION_SOLUTION | Freq: Once | RESPIRATORY_TRACT | Status: AC
  Administered 2022-02-25: 2.5 mg via RESPIRATORY_TRACT

## 2022-02-25 MED ORDER — FEXOFENADINE HCL 180 MG PO TABS: 180.0000 mg | ORAL_TABLET | Freq: Every day | ORAL | 0 refills | Status: AC

## 2022-02-25 MED ORDER — METHYLPREDNISOLONE SODIUM SUCC 125 MG IJ SOLR
125.0000 mg | Freq: Once | INTRAMUSCULAR | Status: AC
  Administered 2022-02-25: 125 mg via INTRAMUSCULAR

## 2022-02-25 MED ORDER — FLUTICASONE PROPIONATE 50 MCG/ACT NA SUSP: 2.0000 | Freq: Every day | NASAL | 1 refills | Status: AC

## 2022-02-25 NOTE — Discharge Instructions (Addendum)
Your symptoms and my physical exam findings are also concerning for exacerbation of your underlying allergies causing significant, reactive inflammation of both your upper and lower respiratory tracts.  You have been advised to take antihistamines, use breathing inhalers and use nasal steroids multiple times at multiple visits in the past few years, I believe this is related to your pattern of bronchitis and upper respiratory infections.  It is important that you are consistent with taking allergy medications and inhalers exactly as prescribed and that you have regular follow up of your allergies and breathing issues with a primary care provider.   ? ?Allergy medications work best when they are taken preventatively. ? ?Not taking your allergy medications on a regular basis increases your risk of more frequent upper respiratory infections that may or may not require the use of antibiotics, serious exacerbations that require the use of oral steroids, loss of time at work and missed social opportunities. ?  ?Antibiotics can increase you risk of diarrhea, C. difficile infection, allergic reactions such as anaphylaxis, Stevens-Johnson syndrome, drug related rashes, yeast infections, systemic lupus.   ?  ?Oral and injected steroids can cause muscle weakness, swelling in the extremities, mood swings, upset stomach with nausea and vomiting, difficulty sleeping, headaches, fatigue and fragile skin.  Oral and injected steroids can also increase your risk of bacterial infections. ?  ? ?Please see the list below for recommended medications, dosages and frequencies to provide relief of your current symptoms:   ?  ?Methylprednisolone IM (Solu-Medrol):  To quickly address your significant respiratory inflammation, you were provided with an injection of methylprednisolone in the office today.  You should continue to feel the full benefit of the steroid for the next 4 to 6 hours.  ? ?Allegra (fexofenadine): This is an excellent  second-generation antihistamine that helps to reduce respiratory inflammatory response to environmental allergens.  This medication is not known to cause daytime sleepiness so it can be taken in the daytime.  If you find that it does make you sleepy, please feel free to take it at bedtime.  This medication is available over-the-counter but I have provided you with a prescription should your insurance be willing to pay for it, I have also provided you with a coupon that she can use without your insurance for best price. ? ?Singulair (montelukast): This is a mast cell stabilizer that works well with antihistamines.  Mast cells are responsible for stimulating histamine production so you can imagine that if we can reduce the activity of your mast cells, then fewer histamines will be produced and inflammation caused by allergy exposure will be significantly reduced.  I recommend you take this medication at the same time you take your antihistamine.  This medication will be covered by your insurance. ?  ?Flonase (fluticasone): This is a steroid nasal spray that you use once daily, 1 spray in each nare.  This medication does not work well if you decide to use it only used as you feel you need to, it works best used on a daily basis.  After 3 to 5 days of use, you will notice significant reduction of the inflammation and mucus production that is currently being caused by exposure to allergens, whether seasonal or environmental.  The most common side effect of this medication is nosebleeds.  If you experience a nosebleed, please discontinue use for 1 week, then feel free to resume.  This medication is available over-the-counter but I have provided you with a prescription should your insurance  be willing to pay for it, I have also provided you with a coupon that she can use without your insurance for best price. ?  ?Ventolin (albuterol): This is a short acting medication that can be used every 4-6 hours as needed for  increased work of breathing, shortness of breath, wheezing and excessive coughing.  This inhaled medication contains a short acting beta agonist bronchodilator which works on the smooth muscle controls the opening and constriction of your airways the medication relaxes these muscles.  The result of relaxation of the smooth muscle is increased air movement and improved work of breathing.  After your nebulizer treatment today, your breath sounds were smoother and the spasming in your lungs with coughing was less intense.  I have provided you with a prescription, please call right away if you find that your insurance prefers a different version of albuterol than the Ventolin version.  I had trouble with [facility] in the past regarding brand-name versus generic albuterol and it seems to be different for every [employer].  ?  ?Based on my physical exam findings and the history you have provided  today, I do not recommend antibiotics at this time.  The risks and side effects of antibiotics would outweigh any minimal benefit that they might provide.     ?   ?Please follow-up within the next 5-7 days either with your primary care provider or urgent care if your symptoms do not resolve.  If you do not have a primary care provider, we will assist you in finding one. ?  ?Thank you for visiting urgent care today.  We appreciate the opportunity to participate in your care. ? ?

## 2022-02-25 NOTE — ED Provider Notes (Addendum)
?[facility] ? ? ? ?CSN: [id number] ?Arrival date & time: 02/25/22  1324 ?  ? ?HISTORY  ? ?Chief Complaint  ?Patient presents with  ? Cough  ? Chills  ? Generalized Body Aches  ? Vomiting  ? Nausea  ? Back Pain  ? ?HPI ?Darrell Winters is a 26 y.o. male. Pt c/o cough productive of clear sputum with occasional streaks of blood, chest congestion, body aches, n/v, right lower back pain.  Pt has hx seasonal allergic rhinitis, not currently on any allergy medications at this time.  Pt has been seen at [facility] and ED frequently for similar sx, pt has been prescribed bronchodilators, oral steroids, nasal steroids, antihistamines repeatedly, pt states not taking any of them at this time and has not tried anything for his sx PTA.     ? ? ?History reviewed. No pertinent past medical history. ?There are no problems to display for this patient. ? ?History reviewed. No pertinent surgical history. ? ?Home Medications   ? ?Prior to Admission medications   ?Medication Sig Start Date End Date Taking? Authorizing Provider  ?albuterol (VENTOLIN HFA) 108 (90 Base) MCG/ACT inhaler Inhale 1-2 puffs into the lungs every 6 (six) hours as needed for wheezing or shortness of breath. 03/13/21   Rhys Martini, PA-C  ?cetirizine (ZYRTEC ALLERGY) 10 MG tablet Take 1 tablet (10 mg total) by mouth daily. 03/04/21   Particia Nearing, PA-C  ?fluticasone (FLONASE) 50 MCG/ACT nasal spray Place 2 sprays into both nostrils daily. 07/03/21   Rushie Chestnut, PA-C  ? ?Family History ?Family History  ?Problem Relation Age of Onset  ? Migraines Mother   ? Asthma Mother   ? Hypertension Father   ? Diabetes Father   ? ?Social History ?Social History  ? ?Tobacco Use  ? Smoking status: Former  ?  Types: E-cigarettes  ? Smokeless tobacco: Never  ?Substance Use Topics  ? Alcohol use: No  ? Drug use: No  ? ?Allergies   ?No known allergies ? ?Review of Systems ?Review of Systems ?Pertinent findings noted in history of present illness.   ? ?Physical Exam ?Triage Vital Signs ?ED Triage Vitals  ?Enc Vitals Group  ?   BP 10/02/21 0827 (!) 147/82  ?   Pulse Rate 10/02/21 0827 72  ?   Resp 10/02/21 0827 18  ?   Temp 10/02/21 0827 98.3 ?F (36.8 ?C)  ?   Temp Source 10/02/21 0827 Oral  ?   SpO2 10/02/21 0827 98 %  ?   Weight --   ?   Height --   ?   Head Circumference --   ?   Peak Flow --   ?   Pain Score 10/02/21 0826 5  ?   Pain Loc --   ?   Pain Edu? --   ?   Excl. in GC? --   ?No data found. ? ?Updated Vital Signs ?BP (!) 157/81 (BP Location: Right Arm)   Pulse (!) 106   Temp 98.3 ?F (36.8 ?C) (Oral)   Resp 18   SpO2 95%  ? ?Physical Exam ?Vitals and nursing note reviewed.  ?Constitutional:   ?   General: He is not in acute distress. ?   Appearance: Normal appearance. He is not ill-appearing.  ?HENT:  ?   Head: Normocephalic and atraumatic.  ?   Salivary Glands: Right salivary gland is not diffusely enlarged or tender. Left salivary gland is not diffusely enlarged or tender.  ?  Right Ear: Ear canal and external ear normal. No drainage. A middle ear effusion is present. There is no impacted cerumen. Tympanic membrane is bulging. Tympanic membrane is not injected or erythematous.  ?   Left Ear: Ear canal and external ear normal. No drainage. A middle ear effusion is present. There is no impacted cerumen. Tympanic membrane is bulging. Tympanic membrane is not injected or erythematous.  ?   Ears:  ?   Comments: Bilateral EACs normal, both TMs bulging with clear fluid ?   Nose: Rhinorrhea present. No nasal deformity, septal deviation, signs of injury, nasal tenderness, mucosal edema or congestion. Rhinorrhea is clear.  ?   Right Nostril: Occlusion present. No foreign body, epistaxis or septal hematoma.  ?   Left Nostril: Occlusion present. No foreign body, epistaxis or septal hematoma.  ?   Right Turbinates: Enlarged, swollen and pale.  ?   Left Turbinates: Enlarged, swollen and pale.  ?   Right Sinus: No maxillary sinus tenderness or frontal sinus  tenderness.  ?   Left Sinus: No maxillary sinus tenderness or frontal sinus tenderness.  ?   Mouth/Throat:  ?   Lips: Pink. No lesions.  ?   Mouth: Mucous membranes are moist. No oral lesions.  ?   Pharynx: Oropharynx is clear. Uvula midline. No posterior oropharyngeal erythema or uvula swelling.  ?   Tonsils: No tonsillar exudate. 0 on the right. 0 on the left.  ?   Comments: Postnasal drip ?Eyes:  ?   General: Lids are normal.     ?   Right eye: No discharge.     ?   Left eye: No discharge.  ?   Extraocular Movements: Extraocular movements intact.  ?   Conjunctiva/sclera: Conjunctivae normal.  ?   Right eye: Right conjunctiva is not injected.  ?   Left eye: Left conjunctiva is not injected.  ?Neck:  ?   Trachea: Trachea and phonation normal.  ?Cardiovascular:  ?   Rate and Rhythm: Normal rate and regular rhythm.  ?   Pulses: Normal pulses.  ?   Heart sounds: Normal heart sounds. No murmur heard. ?  No friction rub. No gallop.  ?Pulmonary:  ?   Effort: Pulmonary effort is normal. No tachypnea, bradypnea, accessory muscle usage, prolonged expiration, respiratory distress or retractions.  ?   Breath sounds: Normal breath sounds and air entry. No stridor, decreased air movement or transmitted upper airway sounds. No decreased breath sounds, wheezing, rhonchi or rales.  ?   Comments: Persistent dry irritative cough with bronchospasm during auscultation. ? ?Repeat auscultation post nebulized albuterol treatment revealed decreased cough. ?Chest:  ?   Chest wall: No tenderness.  ?Musculoskeletal:     ?   General: Normal range of motion.  ?   Cervical back: Normal range of motion and neck supple. Normal range of motion.  ?Lymphadenopathy:  ?   Cervical: No cervical adenopathy.  ?Skin: ?   General: Skin is warm and dry.  ?   Findings: No erythema or rash.  ?Neurological:  ?   General: No focal deficit present.  ?   Mental Status: He is alert and oriented to person, place, and time.  ?Psychiatric:     ?   Mood and Affect:  Mood normal.     ?   Behavior: Behavior normal.  ? ? ?Visual Acuity ?Right Eye Distance:   ?Left Eye Distance:   ?Bilateral Distance:   ? ?Right Eye Near:   ?Left Eye Near:    ?  Bilateral Near:    ? ?UC Couse / Diagnostics / Procedures:  ?  ?EKG ? ?Radiology ?No results found. ? ?Procedures ?Procedures (including critical care time) ? ?UC Diagnoses / Final Clinical Impressions(s)   ?I have reviewed the triage vital signs and the nursing notes. ? ?Pertinent labs & imaging results that were available during my care of the patient were reviewed by me and considered in my medical decision making (see chart for details).   ?Final diagnoses:  ?Acute cough  ?Acute right flank pain  ?Bronchospasm  ?Bronchitis, allergic, unspecified asthma severity, with acute exacerbation  ?Seasonal allergic rhinitis, unspecified trigger  ? ?Patient advised of the likelihood that uncontrolled allergies are likely causing his frequent upper respiratory maladies.  Patient provided with prescriptions for Allegra, Singulair, albuterol and Flonase and advised to try these for the next 3 months to see if he can reduce the frequency and severity of respiratory issues.  Patient also advised to follow-up with his primary care provider to discuss these medications and whether or not to continue them.  Also discussed allergy testing and possible immunotherapy as a possible treatment for his frequent lower respiratory episodes.  Patient provided with an injection of methylprednisolone and an albuterol treatment during his visit today with some improvement of his cough and breath sounds.  Patient advised to return to urgent care if he is unable to see his primary care provider and has not had significant improvement of his symptoms in the next 5 to 7 days.  Urine dip today was unremarkable.   ? ?ED Prescriptions   ? ? Medication Sig Dispense Auth. Provider  ? fexofenadine (ALLEGRA) 180 MG tablet Take 1 tablet (180 mg total) by mouth daily. 90 tablet  Theadora RamaMorgan, Wai Litt Scales, PA-C  ? fluticasone (FLONASE) 50 MCG/ACT nasal spray Place 2 sprays into both nostrils daily. 48 mL Theadora RamaMorgan, Allyah Heather Scales, PA-C  ? albuterol (VENTOLIN HFA) 108 (90 Base) MCG/ACT inhaler Inhale 2 p

## 2022-02-25 NOTE — ED Triage Notes (Addendum)
Pt c/o cough, chest congestion, body aches, n/v, back pain (right lower), coughing up blood- scant.  ?

## 2022-03-02 ENCOUNTER — Telehealth: Payer: Self-pay | Admitting: Emergency Medicine

## 2022-03-02 MED ORDER — ALBUTEROL SULFATE HFA 108 (90 BASE) MCG/ACT IN AERS: 2.0000 | INHALATION_SPRAY | Freq: Four times a day (QID) | RESPIRATORY_TRACT | 2 refills | Status: AC | PRN

## 2022-03-02 NOTE — Telephone Encounter (Signed)
Generic albuterol ordered, had to change to ProAir because insurance will not pay for albuterol as ordered. ?

## 2022-05-23 ENCOUNTER — Telehealth: Payer: BC Managed Care – PPO | Admitting: Emergency Medicine

## 2022-05-23 ENCOUNTER — Encounter: Payer: Self-pay | Admitting: Emergency Medicine

## 2022-05-23 DIAGNOSIS — B9689 Other specified bacterial agents as the cause of diseases classified elsewhere: Secondary | ICD-10-CM | POA: Diagnosis not present

## 2022-05-23 DIAGNOSIS — J208 Acute bronchitis due to other specified organisms: Secondary | ICD-10-CM

## 2022-05-23 MED ORDER — BENZONATATE 100 MG PO CAPS: 100.0000 mg | ORAL_CAPSULE | Freq: Two times a day (BID) | ORAL | 0 refills | Status: AC | PRN

## 2022-05-23 MED ORDER — AZITHROMYCIN 250 MG PO TABS: ORAL_TABLET | ORAL | 0 refills | Status: AC

## 2022-05-23 NOTE — Progress Notes (Signed)
I have spent 5 minutes in review of e-visit questionnaire, review and updating patient chart, medical decision making and response to patient.   Albina Gosney, PA-C    

## 2022-05-23 NOTE — Progress Notes (Signed)

## 2024-02-27 ENCOUNTER — Other Ambulatory Visit: Payer: Self-pay

## 2024-02-27 ENCOUNTER — Emergency Department (HOSPITAL_BASED_OUTPATIENT_CLINIC_OR_DEPARTMENT_OTHER)
Admission: EM | Admit: 2024-02-27 | Discharge: 2024-02-27 | Disposition: A | Discharge status: Home / Self Care | Attending: Emergency Medicine | Admitting: Emergency Medicine

## 2024-02-27 DIAGNOSIS — K529 Noninfective gastroenteritis and colitis, unspecified: Secondary | ICD-10-CM | POA: Insufficient documentation

## 2024-02-27 DIAGNOSIS — R112 Nausea with vomiting, unspecified: Secondary | ICD-10-CM | POA: Diagnosis present

## 2024-02-27 LAB — RESP PANEL BY RT-PCR (RSV, FLU A&B, COVID)  RVPGX2
Influenza A by PCR: NEGATIVE
Influenza B by PCR: NEGATIVE
Resp Syncytial Virus by PCR: NEGATIVE
SARS Coronavirus 2 by RT PCR: NEGATIVE

## 2024-02-27 LAB — URINALYSIS, ROUTINE W REFLEX MICROSCOPIC
Bacteria, UA: NONE SEEN
Bilirubin Urine: NEGATIVE
Glucose, UA: NEGATIVE mg/dL
Ketones, ur: NEGATIVE mg/dL
Leukocytes,Ua: NEGATIVE
Nitrite: NEGATIVE
Specific Gravity, Urine: 1.034 — ABNORMAL HIGH (ref 1.005–1.030)
pH: 5.5 (ref 5.0–8.0)

## 2024-02-27 LAB — CBC
HCT: 51.5 % (ref 39.0–52.0)
Hemoglobin: 18.1 g/dL — ABNORMAL HIGH (ref 13.0–17.0)
MCH: 30.1 pg (ref 26.0–34.0)
MCHC: 35.1 g/dL (ref 30.0–36.0)
MCV: 85.7 fL (ref 80.0–100.0)
Platelets: 202 10*3/uL (ref 150–400)
RBC: 6.01 MIL/uL — ABNORMAL HIGH (ref 4.22–5.81)
RDW: 11.8 % (ref 11.5–15.5)
WBC: 16.4 10*3/uL — ABNORMAL HIGH (ref 4.0–10.5)
nRBC: 0 % (ref 0.0–0.2)

## 2024-02-27 LAB — COMPREHENSIVE METABOLIC PANEL
ALT: 14 U/L (ref 0–44)
AST: 17 U/L (ref 15–41)
Albumin: 5 g/dL (ref 3.5–5.0)
Alkaline Phosphatase: 61 U/L (ref 38–126)
Anion gap: 13 (ref 5–15)
BUN: 17 mg/dL (ref 6–20)
CO2: 20 mmol/L — ABNORMAL LOW (ref 22–32)
Calcium: 9.6 mg/dL (ref 8.9–10.3)
Chloride: 105 mmol/L (ref 98–111)
Creatinine, Ser: 0.8 mg/dL (ref 0.61–1.24)
GFR, Estimated: >60 mL/min (ref >60)
Glucose, Bld: 117 mg/dL — ABNORMAL HIGH (ref 70–99)
Potassium: 3.9 mmol/L (ref 3.5–5.1)
Sodium: 138 mmol/L (ref 135–145)
Total Bilirubin: 1.4 mg/dL — ABNORMAL HIGH (ref 0.0–1.2)
Total Protein: 7.5 g/dL (ref 6.5–8.1)

## 2024-02-27 LAB — LIPASE, BLOOD: Lipase: 13 U/L (ref 11–51)

## 2024-02-27 MED ORDER — KETOROLAC TROMETHAMINE 15 MG/ML IJ SOLN
15.0000 mg | Freq: Once | INTRAMUSCULAR | Status: AC
  Administered 2024-02-27: 15 mg via INTRAVENOUS
  Filled 2024-02-27: qty 1

## 2024-02-27 MED ORDER — DIPHENHYDRAMINE HCL 50 MG/ML IJ SOLN
12.5000 mg | Freq: Once | INTRAMUSCULAR | Status: AC
  Administered 2024-02-27: 12.5 mg via INTRAVENOUS
  Filled 2024-02-27: qty 1

## 2024-02-27 MED ORDER — ONDANSETRON 4 MG PO TBDP
4.0000 mg | ORAL_TABLET | Freq: Once | ORAL | Status: AC | PRN
  Administered 2024-02-27: 4 mg via ORAL
  Filled 2024-02-27: qty 1

## 2024-02-27 MED ORDER — ONDANSETRON 4 MG PO TBDP: 4.0000 mg | ORAL_TABLET | Freq: Three times a day (TID) | ORAL | 0 refills | Status: AC | PRN

## 2024-02-27 MED ORDER — METOCLOPRAMIDE HCL 5 MG/ML IJ SOLN
10.0000 mg | Freq: Once | INTRAMUSCULAR | Status: AC
  Administered 2024-02-27: 10 mg via INTRAVENOUS
  Filled 2024-02-27: qty 2

## 2024-02-27 MED ORDER — SODIUM CHLORIDE 0.9 % IV BOLUS
1000.0000 mL | Freq: Once | INTRAVENOUS | Status: AC
  Administered 2024-02-27: 1000 mL via INTRAVENOUS

## 2024-02-27 NOTE — ED Triage Notes (Signed)
 Pt states that on Saturday he began having lower back pain in the center of his spine. Pt states that pain began radiating around to his flanks over the weekend, and today he developed frequent N/V. Denies hx of kidney stones, no dysuria or hematuria. Pt also c/o body aches, cough and chills.

## 2024-02-27 NOTE — ED Provider Notes (Signed)
 [place] EMERGENCY DEPARTMENT AT [facility] Provider Note   CSN: [id number] Arrival date & time: 02/27/24  1626     History {Add pertinent medical, surgical, social history, OB history to HPI:1} Chief Complaint  Patient presents with   Emesis    Darrell Winters is a 28 y.o. male.  28 year old male previously healthy who presents emergency department with nausea, vomiting, diarrhea, and back pain.  Patient reports that since Saturday he has been feeling poorly.  Initially started having some mid back pain and then reports bilateral flank pain.  Has been having some burning when he pees.  Off-and-on chills but no recorded fevers at home.  Started having nausea and vomiting and diarrhea.  Has had approximately 5 episodes of nonbloody nonbilious emesis per day.  Also has been having multiple loose stools per day.  No IV drug use.  No history of indwelling catheters.  No abdominal surgeries.  No alcohol or recreational drug use.  Says that he occasionally gets pain in the sides like this when he gets sick.       Home Medications Prior to Admission medications   Medication Sig Start Date End Date Taking? Authorizing Provider  albuterol (VENTOLIN HFA) 108 (90 Base) MCG/ACT inhaler Inhale 2 puffs into the lungs every 6 (six) hours as needed for wheezing or shortness of breath (Cough). 03/02/22   Theadora Rama Scales, PA-C  benzonatate (TESSALON) 100 MG capsule Take 1 capsule (100 mg total) by mouth 2 (two) times daily as needed for cough. 05/23/22   Wurst, [place], PA-C  fexofenadine (ALLEGRA) 180 MG tablet Take 1 tablet (180 mg total) by mouth daily. 02/25/22 05/26/22  Theadora Rama Scales, PA-C  fluticasone (FLONASE) 50 MCG/ACT nasal spray Place 2 sprays into both nostrils daily. 02/25/22   Theadora Rama Scales, PA-C  montelukast (SINGULAIR) 10 MG tablet Take 1 tablet (10 mg total) by mouth at bedtime. 02/25/22 08/24/22  Theadora Rama Scales, PA-C      Allergies    No  known allergies    Review of Systems   Review of Systems  Physical Exam Updated Vital Signs BP (!) 176/137 (BP Location: Left Arm)   Pulse (!) 102   Temp 98.3 F (36.8 C) (Oral)   Resp 20   Ht 5\' 9"  (1.753 m)   Wt 81.6 kg   SpO2 100%   BMI 26.58 kg/m  Physical Exam Vitals and nursing note reviewed.  Constitutional:      General: He is not in acute distress.    Appearance: He is well-developed.  HENT:     Head: Normocephalic and atraumatic.     Right Ear: External ear normal.     Left Ear: External ear normal.     Nose: Nose normal.  Eyes:     Extraocular Movements: Extraocular movements intact.     Conjunctiva/sclera: Conjunctivae normal.     Pupils: Pupils are equal, round, and reactive to light.  Cardiovascular:     Rate and Rhythm: Normal rate and regular rhythm.     Heart sounds: Normal heart sounds.  Pulmonary:     Effort: Pulmonary effort is normal. No respiratory distress.     Breath sounds: Normal breath sounds.  Abdominal:     General: There is no distension.     Palpations: Abdomen is soft. There is no mass.     Tenderness: There is no abdominal tenderness. There is no right CVA tenderness, left CVA tenderness or guarding.     Comments: Negative  murphy's sign  Musculoskeletal:     Cervical back: Normal range of motion and neck supple.     Right lower leg: No edema.     Left lower leg: No edema.  Skin:    General: Skin is warm and dry.  Neurological:     Mental Status: He is alert. Mental status is at baseline.  Psychiatric:        Mood and Affect: Mood normal.        Behavior: Behavior normal.     ED Results / Procedures / Treatments   Labs (all labs ordered are listed, but only abnormal results are displayed) Labs Reviewed  COMPREHENSIVE METABOLIC PANEL - Abnormal; Notable for the following components:      Result Value   CO2 20 (*)    Glucose, Bld 117 (*)    Total Bilirubin 1.4 (*)    All other components within normal limits  CBC -  Abnormal; Notable for the following components:   WBC 16.4 (*)    RBC 6.01 (*)    Hemoglobin 18.1 (*)    All other components within normal limits  RESP PANEL BY RT-PCR (RSV, FLU A&B, COVID)  RVPGX2  LIPASE, BLOOD  URINALYSIS, ROUTINE W REFLEX MICROSCOPIC    EKG None  Radiology No results found.  Procedures Procedures  {Document cardiac monitor, telemetry assessment procedure when appropriate:1}  Medications Ordered in ED Medications  ondansetron (ZOFRAN-ODT) disintegrating tablet 4 mg (4 mg Oral Given 02/27/24 1656)    ED Course/ Medical Decision Making/ A&P   {   Click here for ABCD2, HEART and other calculatorsREFRESH Note before signing :1}                              Medical Decision Making Amount and/or Complexity of Data Reviewed Labs: ordered.  Risk Prescription drug management.   ***  {Document critical care time when appropriate:1} {Document review of labs and clinical decision tools ie heart score, Chads2Vasc2 etc:1}  {Document your independent review of radiology images, and any outside records:1} {Document your discussion with family members, caretakers, and with consultants:1} {Document social determinants of health affecting pt's care:1} {Document your decision making why or why not admission, treatments were needed:1} Final Clinical Impression(s) / ED Diagnoses Final diagnoses:  None    Rx / DC Orders ED Discharge Orders     None

## 2024-02-27 NOTE — ED Notes (Signed)
Pt given water per EDP order

## 2024-02-27 NOTE — ED Notes (Signed)
 Pt tolerating water appropriately at this time

## 2024-02-27 NOTE — Discharge Instructions (Signed)
 You were seen for your viral bug (gastroenteritis) and back pain in the emergency department.  At home, please take the Zofran for your nausea and vomiting. Please be sure to stay well-hydrated.  Follow-up with your primary doctor in 2-3 days regarding your visit.  Return immediately to the emergency department if you experience any of the following: fainting, abdominal pain, high fevers, or any other concerning symptoms.  Thank you for visiting our Emergency Department. It was a pleasure taking care of you today.

## 2024-02-27 NOTE — ED Notes (Signed)
 Pt aware of need for urine sample, unable to provide at this time.

## 2024-02-27 NOTE — ED Notes (Signed)
 Pt reports burning and pain with urination. EDP notified.

## 2024-05-05 ENCOUNTER — Telehealth

## 2024-05-05 DIAGNOSIS — W57XXXA Bitten or stung by nonvenomous insect and other nonvenomous arthropods, initial encounter: Secondary | ICD-10-CM | POA: Diagnosis not present

## 2024-05-05 DIAGNOSIS — S30861A Insect bite (nonvenomous) of abdominal wall, initial encounter: Secondary | ICD-10-CM | POA: Diagnosis not present

## 2024-05-06 MED ORDER — DOXYCYCLINE HYCLATE 100 MG PO TABS: 100.0000 mg | ORAL_TABLET | Freq: Two times a day (BID) | ORAL | 0 refills | Status: AC

## 2024-05-06 NOTE — Progress Notes (Signed)
E-Visit for Tick Bite  Thank you for describing your tick bite, Here is how we plan to help! Based on the information that you shared with me it looks like you have An infected or complicated tick bite that requires a longer course of antibiotics and will need for you to schedule a follow-up visit with a provider.  In most cases a tick bite is painless and does not itch.  Most tick bites in which the tick is quickly removed do not require prescriptions. Ticks can transmit several diseases if they are infected and remain attacked to your skin. Therefore the length that the tick was attached and any symptoms you have experienced after the bite are import to accurately develop your custom treatment plan. In most cases a single dose of doxycycline may prevent the development of a more serious condition.  Based on your information I have Provided a home care guide for tick bites and  instructions on when to call for help. and Your symptoms indicate that you need a longer course of antibiotics and a follow up visit with a provider. I have sent doxycycline 100 mg twice a day for 21 days to the pharmacy that you selected. You will need to schedule a follow up visit with your provider. If you do not have a primary care provider you may use our telehealth physicians on the web at [place]  are associated with illness?  The Wood Tick (dog tick) is the size of a watermelon seed and can sometimes transmit [facility] spotted fever and [place] tick fever.   The Deer Tick (black-legged tick) is between the size of a poppy seed (pin head) and an apple seed, and can sometimes transmit Lyme disease.  A brown to black tick with a white splotch on its back is likely a male Amblyomma americanum ([place] tick). This tick has been associated with Southern Tick Associated illness ( STARI)  Lyme disease has become the most common tick-borne illness in the [place]. The risk of Lyme  disease following a recognized deer tick bite is estimated to be 1%.  The majority of cases of Lyme disease start with a bull's eye rash at the site of the tick bite. The rash can occur days to weeks (typically 7-10 days) after a tick bite. Treatment with antibiotics is indicated if this rash appears. Flu-like symptoms may accompany the rash, including: fever, chills, headaches, muscle aches, and fatigue. Removing ticks promptly may prevent tick borne disease.  What can be used to prevent Tick Bites?  Insect repellant with at leas 20% DEET. Wearing long pants with sock and shoes. Avoiding tall grass and heavily wooded areas. Checking your skin after being outdoors. Shower with a washcloth after outdoor exposures.  HOME CARE ADVICE FOR TICK BITE  Wood Tick Removal:  Use a pair of tweezers and grasp the wood tick close to the skin (on its head). Pull the wood tick straight upward without twisting or crushing it. Maintain a steady pressure until it releases its grip.   If tweezers aren't available, use fingers, a loop of thread around the jaws, or a needle between the jaws for traction.  Note: covering the tick with petroleum jelly, nail polish or rubbing alcohol doesn't work. Neither does touching the tick with a hot or cold object. Tiny Deer Tick Removal:   Needs to be scraped off with a knife blade or credit card edge. Place tick in a sealed container (e.g. glass jar,  zip lock plastic bag), in case your doctor wants to see it. Tick's Head Removal:  If the wood tick's head breaks off in the skin, it must be removed. Clean the skin. Then use a sterile needle to uncover the head and lift it out or scrape it off.  If a very small piece of the head remains, the skin will eventually slough it off. Antibiotic Ointment:  Wash the wound and your hands with soap and water after removal to prevent catching any tick disease.  Apply an over the counter antibiotic ointment (e.g. bacitracin) to the bite  once. Expected Course: Tick bites normally don't itch or hurt. That's why they often go unnoticed. Call Your Doctor If:  You can't remove the tick or the tick's head Fever, a severe head ache, or rash occur in the next 2 weeks Bite begins to look infected Lyme's disease is common in your area You have not had a tetanus in the last 10 years Your current symptoms become worse    MAKE SURE YOU  Understand these instructions. Will watch your condition. Will get help right away if you are not doing well or get worse.    Thank you for choosing an e-visit.  Your e-visit answers were reviewed by a board certified advanced clinical practitioner to complete your personal care plan. Depending upon the condition, your plan could have included both over the counter or prescription medications.  Please review your pharmacy choice. Make sure the pharmacy is open so you can pick up prescription now. If there is a problem, you may contact your provider through [employer] and have the prescription routed to another pharmacy.  Your safety is important to [place]. If you have drug allergies check your prescription carefully.   For the next 24 hours you can use MyChart to ask questions about today's visit, request a non-urgent call back, or ask for a work or school excuse. You will get an email in the next two days asking about your experience. I hope that your e-visit has been valuable and will speed your recovery.  Approximately 5 minutes was spent documenting and reviewing patient's chart.

## 2024-07-06 ENCOUNTER — Encounter: Payer: Self-pay | Admitting: Physician Assistant

## 2024-07-06 ENCOUNTER — Encounter: Payer: Self-pay | Admitting: Advanced Practice Midwife

## 2024-07-09 ENCOUNTER — Other Ambulatory Visit: Payer: Self-pay | Admitting: Surgery

## 2024-07-09 DIAGNOSIS — N62 Hypertrophy of breast: Secondary | ICD-10-CM

## 2024-07-16 ENCOUNTER — Other Ambulatory Visit: Payer: Self-pay | Admitting: Surgery

## 2024-07-16 DIAGNOSIS — N62 Hypertrophy of breast: Secondary | ICD-10-CM

## 2024-07-20 ENCOUNTER — Other Ambulatory Visit

## 2024-07-20 ENCOUNTER — Encounter

## 2024-08-03 ENCOUNTER — Other Ambulatory Visit

## 2024-08-03 ENCOUNTER — Encounter
